# Patient Record
Sex: Female | Born: 1965 | Race: Black or African American | Hispanic: No | Marital: Married | Smoking: Never smoker
Health system: Southern US, Community
[De-identification: ages and names within clinical notes are randomized; demographics above are authoritative.]

## PROBLEM LIST (undated history)

## (undated) DIAGNOSIS — L309 Dermatitis, unspecified: Secondary | ICD-10-CM

## (undated) DIAGNOSIS — N289 Disorder of kidney and ureter, unspecified: Secondary | ICD-10-CM

## (undated) DIAGNOSIS — M25559 Pain in unspecified hip: Secondary | ICD-10-CM

## (undated) DIAGNOSIS — O903 Peripartum cardiomyopathy: Secondary | ICD-10-CM

## (undated) DIAGNOSIS — M542 Cervicalgia: Secondary | ICD-10-CM

## (undated) DIAGNOSIS — J45909 Unspecified asthma, uncomplicated: Secondary | ICD-10-CM

## (undated) DIAGNOSIS — I10 Essential (primary) hypertension: Secondary | ICD-10-CM

## (undated) DIAGNOSIS — A6000 Herpesviral infection of urogenital system, unspecified: Secondary | ICD-10-CM

## (undated) DIAGNOSIS — E119 Type 2 diabetes mellitus without complications: Secondary | ICD-10-CM

## (undated) DIAGNOSIS — M549 Dorsalgia, unspecified: Secondary | ICD-10-CM

## (undated) DIAGNOSIS — K439 Ventral hernia without obstruction or gangrene: Secondary | ICD-10-CM

## (undated) DIAGNOSIS — D219 Benign neoplasm of connective and other soft tissue, unspecified: Secondary | ICD-10-CM

## (undated) HISTORY — PX: HERNIA REPAIR: SHX51

## (undated) HISTORY — DX: Pain in unspecified hip: M25.559

## (undated) HISTORY — DX: Unspecified asthma, uncomplicated: J45.909

## (undated) HISTORY — DX: Type 2 diabetes mellitus without complications: E11.9

## (undated) HISTORY — DX: Benign neoplasm of connective and other soft tissue, unspecified: D21.9

## (undated) HISTORY — PX: OTHER SURGICAL HISTORY: SHX169

## (undated) HISTORY — DX: Herpesviral infection of urogenital system, unspecified: A60.00

## (undated) HISTORY — DX: Dorsalgia, unspecified: M54.9

## (undated) HISTORY — DX: Ventral hernia without obstruction or gangrene: K43.9

## (undated) HISTORY — DX: Dermatitis, unspecified: L30.9

## (undated) HISTORY — DX: Disorder of kidney and ureter, unspecified: N28.9

## (undated) HISTORY — PX: RENAL BIOPSY: SHX156

## (undated) HISTORY — DX: Cervicalgia: M54.2

## (undated) HISTORY — PX: US ECHOCARDIOGRAPHY: HXRAD669

## (undated) HISTORY — DX: Peripartum cardiomyopathy: O90.3

## (undated) HISTORY — PX: TUBAL LIGATION: SHX77

---

## 1998-07-21 ENCOUNTER — Ambulatory Visit (HOSPITAL_COMMUNITY): Admission: RE | Admit: 1998-07-21 | Discharge: 1998-07-21 | Payer: Self-pay | Admitting: Obstetrics & Gynecology

## 1998-07-21 ENCOUNTER — Encounter: Payer: Self-pay | Admitting: Obstetrics & Gynecology

## 1998-12-24 ENCOUNTER — Emergency Department (HOSPITAL_COMMUNITY): Admission: EM | Admit: 1998-12-24 | Discharge: 1998-12-24 | Payer: Self-pay

## 1998-12-31 ENCOUNTER — Emergency Department (HOSPITAL_COMMUNITY): Admission: EM | Admit: 1998-12-31 | Discharge: 1998-12-31 | Payer: Self-pay | Admitting: *Deleted

## 1999-10-07 ENCOUNTER — Emergency Department (HOSPITAL_COMMUNITY): Admission: EM | Admit: 1999-10-07 | Discharge: 1999-10-07 | Payer: Self-pay

## 2000-09-10 ENCOUNTER — Other Ambulatory Visit: Admission: RE | Admit: 2000-09-10 | Discharge: 2000-09-10 | Payer: Self-pay | Admitting: Obstetrics and Gynecology

## 2001-09-11 ENCOUNTER — Other Ambulatory Visit: Admission: RE | Admit: 2001-09-11 | Discharge: 2001-09-11 | Payer: Self-pay | Admitting: Obstetrics and Gynecology

## 2001-10-13 ENCOUNTER — Encounter: Payer: Self-pay | Admitting: Emergency Medicine

## 2001-10-13 ENCOUNTER — Emergency Department (HOSPITAL_COMMUNITY): Admission: EM | Admit: 2001-10-13 | Discharge: 2001-10-14 | Payer: Self-pay | Admitting: Emergency Medicine

## 2002-12-17 ENCOUNTER — Encounter: Admission: RE | Admit: 2002-12-17 | Discharge: 2002-12-17 | Payer: Self-pay | Admitting: Obstetrics and Gynecology

## 2003-01-26 ENCOUNTER — Emergency Department (HOSPITAL_COMMUNITY): Admission: EM | Admit: 2003-01-26 | Discharge: 2003-01-27 | Payer: Self-pay | Admitting: Emergency Medicine

## 2003-05-17 ENCOUNTER — Encounter: Admission: RE | Admit: 2003-05-17 | Discharge: 2003-05-17 | Payer: Self-pay | Admitting: Family Medicine

## 2004-02-15 ENCOUNTER — Emergency Department (HOSPITAL_COMMUNITY): Admission: EM | Admit: 2004-02-15 | Discharge: 2004-02-15 | Payer: Self-pay | Admitting: Emergency Medicine

## 2004-02-28 ENCOUNTER — Encounter: Admission: RE | Admit: 2004-02-28 | Discharge: 2004-02-28 | Payer: Self-pay | Admitting: Obstetrics and Gynecology

## 2005-02-05 DIAGNOSIS — E119 Type 2 diabetes mellitus without complications: Secondary | ICD-10-CM

## 2005-02-05 HISTORY — DX: Type 2 diabetes mellitus without complications: E11.9

## 2005-03-27 ENCOUNTER — Encounter: Admission: RE | Admit: 2005-03-27 | Discharge: 2005-03-27 | Payer: Self-pay | Admitting: Nephrology

## 2005-07-10 ENCOUNTER — Encounter: Admission: RE | Admit: 2005-07-10 | Discharge: 2005-07-10 | Payer: Self-pay | Admitting: Obstetrics and Gynecology

## 2005-08-15 ENCOUNTER — Inpatient Hospital Stay (HOSPITAL_COMMUNITY): Admission: AD | Admit: 2005-08-15 | Discharge: 2005-08-15 | Payer: Self-pay | Admitting: Obstetrics and Gynecology

## 2005-09-21 ENCOUNTER — Inpatient Hospital Stay (HOSPITAL_COMMUNITY): Admission: AD | Admit: 2005-09-21 | Discharge: 2005-09-24 | Payer: Self-pay | Admitting: Obstetrics and Gynecology

## 2006-01-17 ENCOUNTER — Ambulatory Visit (HOSPITAL_COMMUNITY): Admission: RE | Admit: 2006-01-17 | Discharge: 2006-01-17 | Payer: Self-pay | Admitting: Nephrology

## 2006-01-24 ENCOUNTER — Encounter (INDEPENDENT_AMBULATORY_CARE_PROVIDER_SITE_OTHER): Payer: Self-pay | Admitting: *Deleted

## 2006-01-25 ENCOUNTER — Inpatient Hospital Stay (HOSPITAL_COMMUNITY): Admission: AD | Admit: 2006-01-25 | Discharge: 2006-01-30 | Payer: Self-pay | Admitting: Nephrology

## 2006-02-01 ENCOUNTER — Inpatient Hospital Stay (HOSPITAL_COMMUNITY): Admission: EM | Admit: 2006-02-01 | Discharge: 2006-02-03 | Payer: Self-pay | Admitting: Emergency Medicine

## 2006-02-09 ENCOUNTER — Inpatient Hospital Stay (HOSPITAL_COMMUNITY): Admission: EM | Admit: 2006-02-09 | Discharge: 2006-02-12 | Payer: Self-pay | Admitting: Emergency Medicine

## 2006-02-19 ENCOUNTER — Ambulatory Visit (HOSPITAL_COMMUNITY): Admission: RE | Admit: 2006-02-19 | Discharge: 2006-02-19 | Payer: Self-pay | Admitting: General Surgery

## 2006-02-26 ENCOUNTER — Emergency Department (HOSPITAL_COMMUNITY): Admission: EM | Admit: 2006-02-26 | Discharge: 2006-02-27 | Payer: Self-pay | Admitting: Emergency Medicine

## 2006-06-12 ENCOUNTER — Encounter: Admission: RE | Admit: 2006-06-12 | Discharge: 2006-06-12 | Payer: Self-pay | Admitting: Obstetrics and Gynecology

## 2006-07-17 ENCOUNTER — Encounter: Admission: RE | Admit: 2006-07-17 | Discharge: 2006-07-17 | Payer: Self-pay | Admitting: Obstetrics and Gynecology

## 2007-01-15 ENCOUNTER — Encounter: Admission: RE | Admit: 2007-01-15 | Discharge: 2007-01-15 | Payer: Self-pay | Admitting: Obstetrics and Gynecology

## 2007-06-13 ENCOUNTER — Encounter: Admission: RE | Admit: 2007-06-13 | Discharge: 2007-06-13 | Payer: Self-pay | Admitting: Obstetrics and Gynecology

## 2007-06-19 ENCOUNTER — Encounter: Admission: RE | Admit: 2007-06-19 | Discharge: 2007-06-19 | Payer: Self-pay | Admitting: Obstetrics and Gynecology

## 2007-09-28 ENCOUNTER — Emergency Department (HOSPITAL_COMMUNITY): Admission: EM | Admit: 2007-09-28 | Discharge: 2007-09-28 | Payer: Self-pay | Admitting: Emergency Medicine

## 2008-01-20 IMAGING — US US RENAL
1 series · 14 of 25 positions shown · non-contrast
Comparison: [REDACTED] chest CT, 01/27/03.

CLINICAL DATA: Proteinuria.  
RENAL ULTRASOUND:
Technique  Complete ultrasound examination of the urinary tract was performed including evaluation of the kidneys, renal collecting systems, and urinary bladder.

[Series 1: us renal · 0.27mm/px · 14 of 31 slices shown]
[im 1/31]
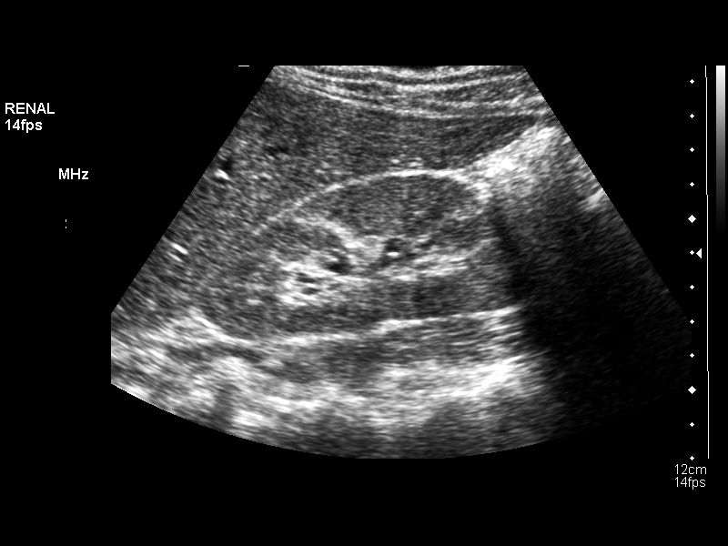
[im 3/31]
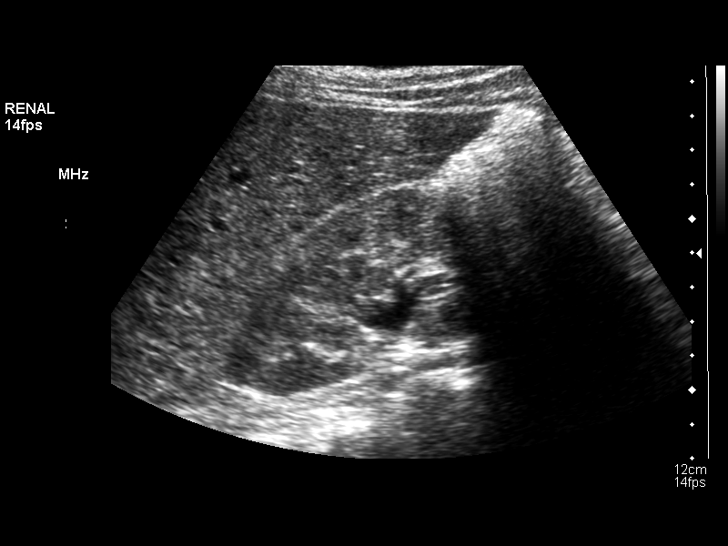
[im 6/31]
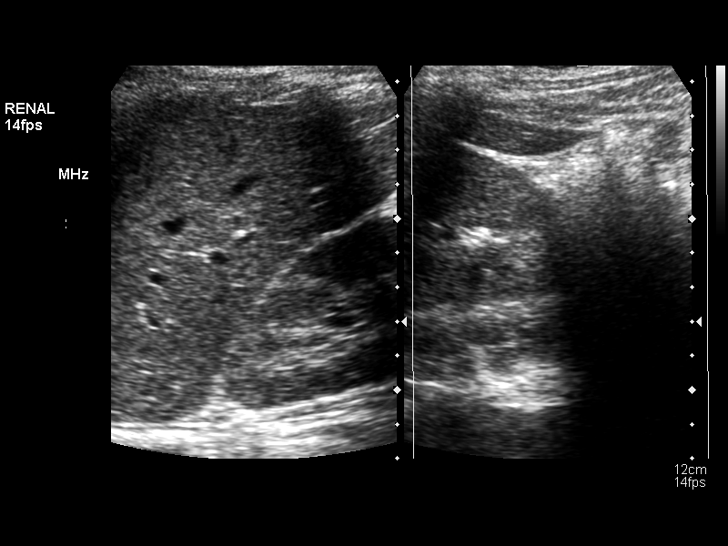
[im 8/31]
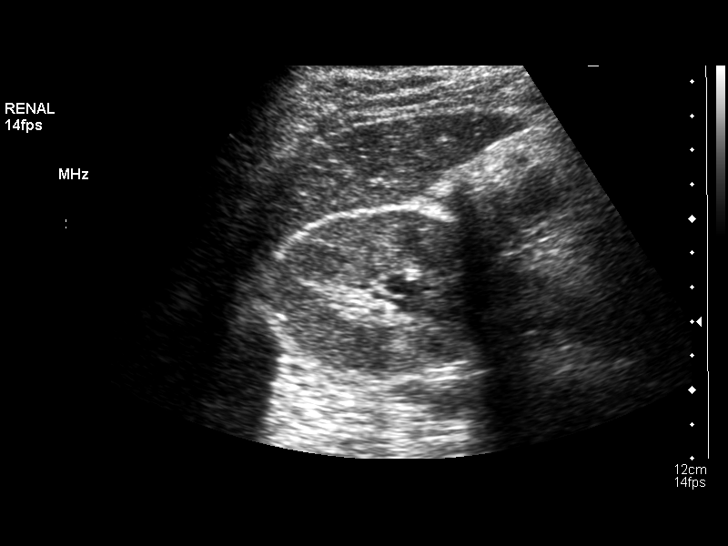
[im 11/31]
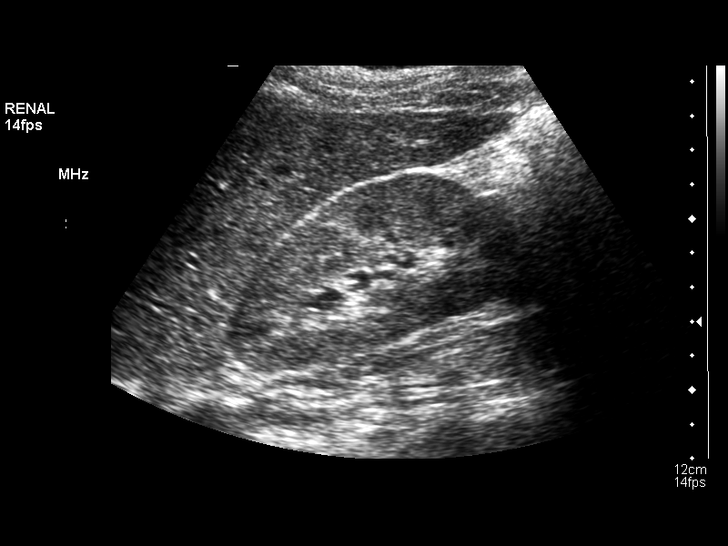
[im 12/31]
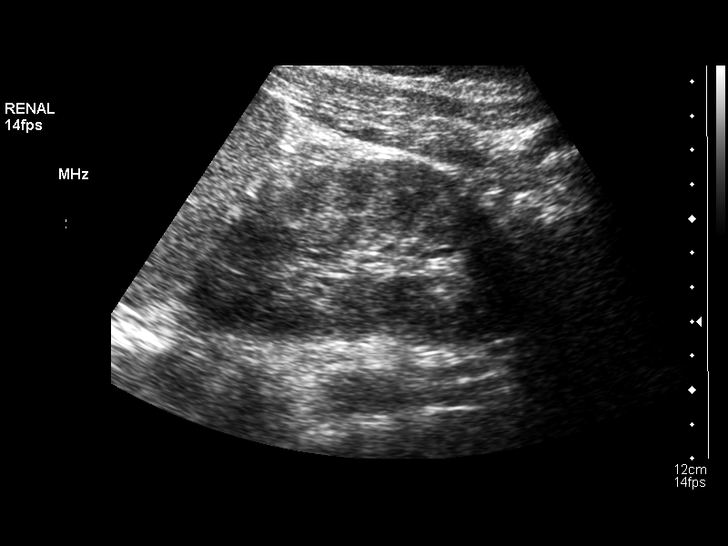
[im 14/31]
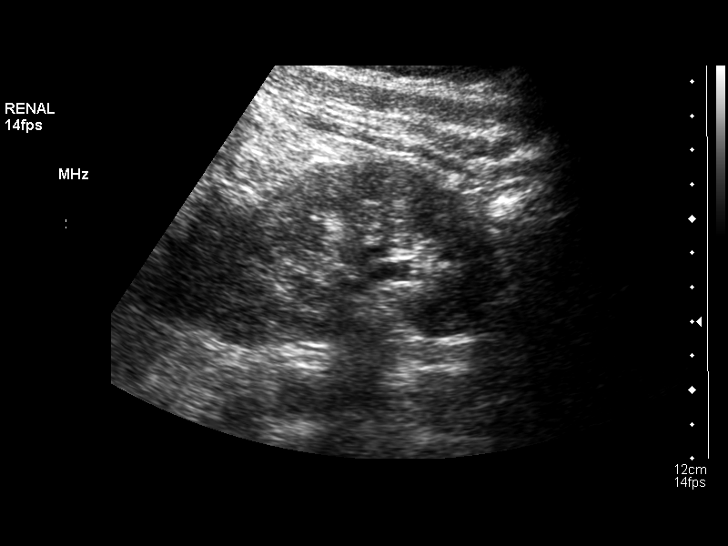
[im 17/31]
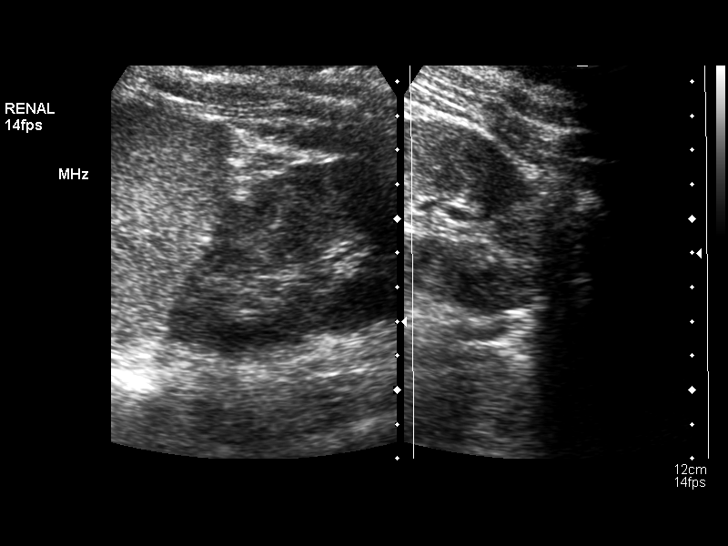
[im 19/31]
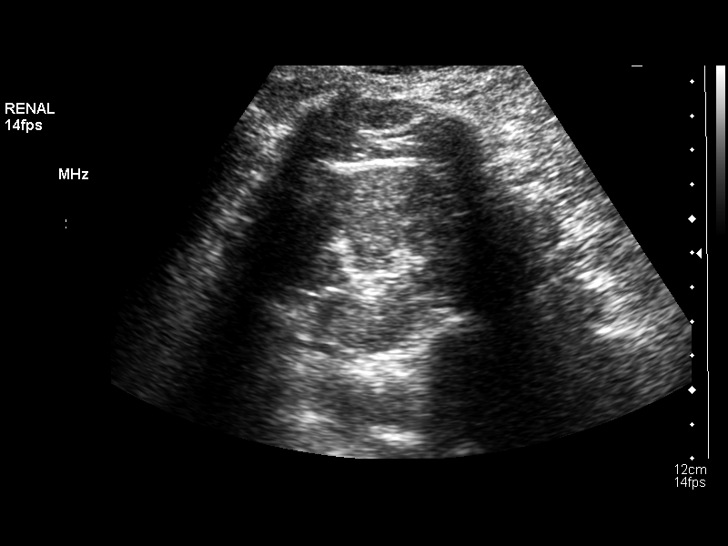
[im 21/31]
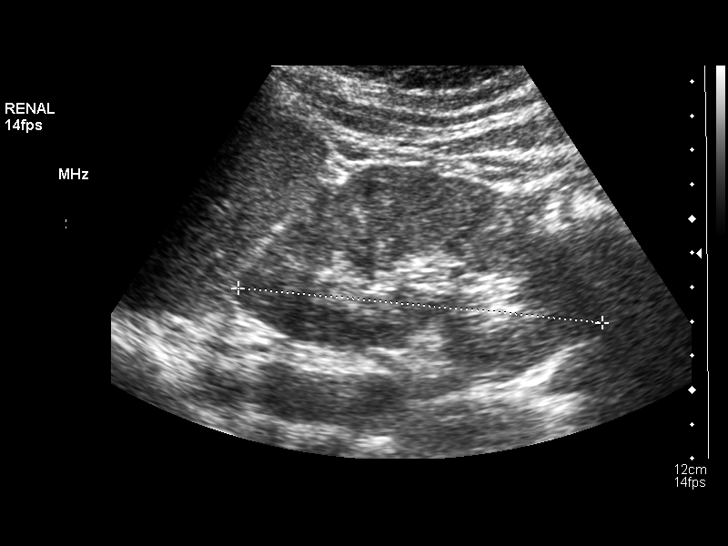
[im 23/31]
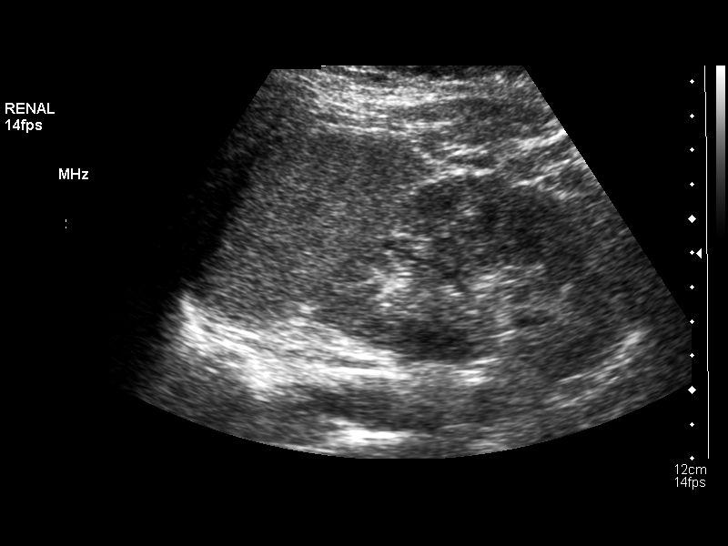
[im 26/31]
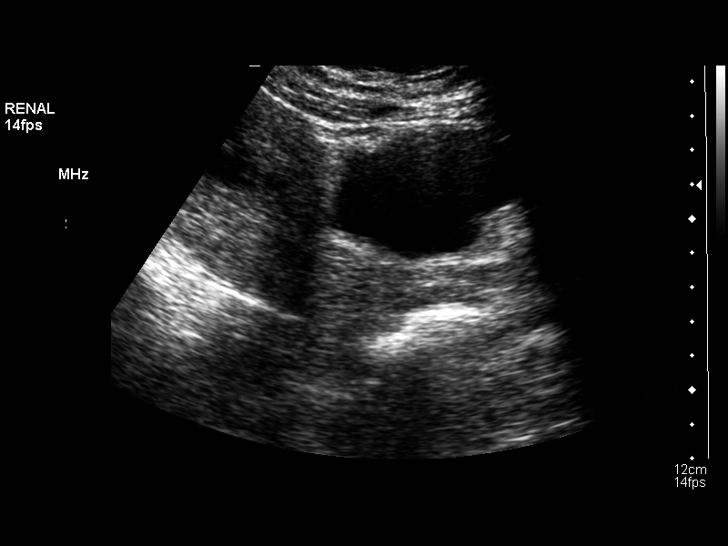
[im 28/31]
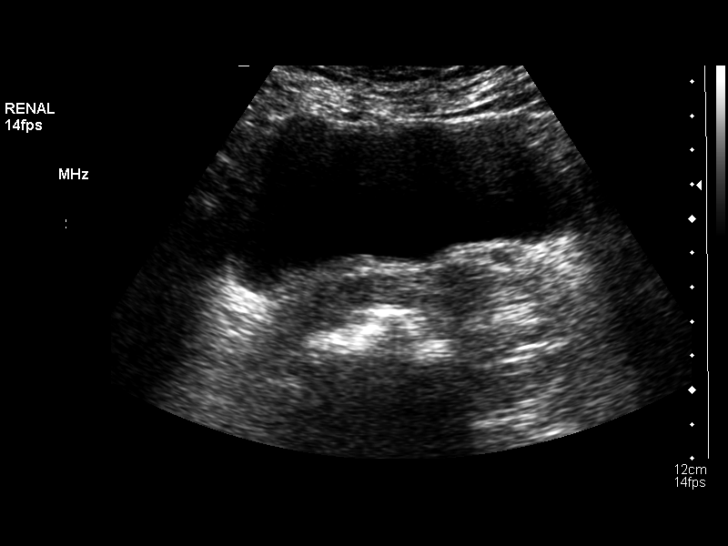
[im 31/31]
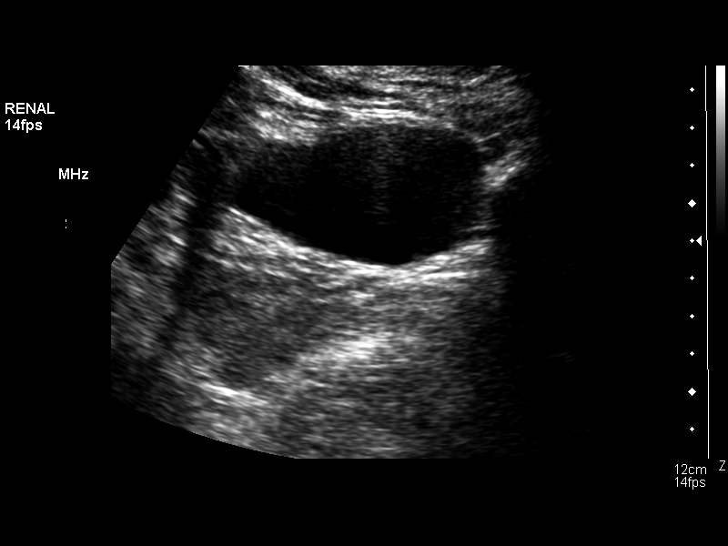

[14 of 25 positions shown; findings below may reference images not displayed]

FINDINGS: Renal cortex diffusely increased echogenicity consistent with slight nonspecific medical renal disease.  Renal size is normal with the right kidney measuring 9.5 cm long and the left kidney 10.1 cm with no focal renal lesion nor obstructive hydronephrosis seen.  Urinary bladder appears normal.
IMPRESSION: 1.  Renal cortical echogenicity change consistent with slight nonspecific medical renal disease. 
2.  Otherwise negative.

## 2008-02-21 ENCOUNTER — Inpatient Hospital Stay (HOSPITAL_COMMUNITY): Admission: AD | Admit: 2008-02-21 | Discharge: 2008-02-22 | Payer: Self-pay | Admitting: Obstetrics and Gynecology

## 2008-03-08 DIAGNOSIS — K439 Ventral hernia without obstruction or gangrene: Secondary | ICD-10-CM

## 2008-03-08 HISTORY — DX: Ventral hernia without obstruction or gangrene: K43.9

## 2008-03-12 ENCOUNTER — Inpatient Hospital Stay (HOSPITAL_COMMUNITY): Admission: AD | Admit: 2008-03-12 | Discharge: 2008-03-12 | Payer: Self-pay | Admitting: Obstetrics & Gynecology

## 2008-03-16 ENCOUNTER — Encounter: Admission: RE | Admit: 2008-03-16 | Discharge: 2008-03-16 | Payer: Self-pay | Admitting: Obstetrics and Gynecology

## 2008-06-01 ENCOUNTER — Inpatient Hospital Stay (HOSPITAL_COMMUNITY): Admission: AD | Admit: 2008-06-01 | Discharge: 2008-06-01 | Payer: Self-pay | Admitting: Obstetrics

## 2008-06-21 ENCOUNTER — Observation Stay (HOSPITAL_COMMUNITY): Admission: AD | Admit: 2008-06-21 | Discharge: 2008-06-22 | Payer: Self-pay | Admitting: Obstetrics and Gynecology

## 2008-06-24 ENCOUNTER — Encounter (INDEPENDENT_AMBULATORY_CARE_PROVIDER_SITE_OTHER): Payer: Self-pay | Admitting: Obstetrics and Gynecology

## 2008-06-24 ENCOUNTER — Inpatient Hospital Stay (HOSPITAL_COMMUNITY): Admission: AD | Admit: 2008-06-24 | Discharge: 2008-06-27 | Payer: Self-pay | Admitting: Obstetrics and Gynecology

## 2008-07-04 ENCOUNTER — Inpatient Hospital Stay (HOSPITAL_COMMUNITY): Admission: AD | Admit: 2008-07-04 | Discharge: 2008-07-04 | Payer: Self-pay | Admitting: Obstetrics & Gynecology

## 2008-07-19 ENCOUNTER — Ambulatory Visit: Payer: Self-pay | Admitting: Internal Medicine

## 2008-07-19 ENCOUNTER — Inpatient Hospital Stay (HOSPITAL_COMMUNITY): Admission: EM | Admit: 2008-07-19 | Discharge: 2008-07-23 | Payer: Self-pay | Admitting: Podiatry

## 2008-07-20 ENCOUNTER — Encounter (INDEPENDENT_AMBULATORY_CARE_PROVIDER_SITE_OTHER): Payer: Self-pay | Admitting: Internal Medicine

## 2008-08-12 ENCOUNTER — Encounter (HOSPITAL_COMMUNITY): Admission: RE | Admit: 2008-08-12 | Discharge: 2008-10-06 | Payer: Self-pay | Admitting: Interventional Cardiology

## 2009-05-11 IMAGING — CR DG ORBITS FOR FOREIGN BODY
2 series · 2 of 2 positions shown · non-contrast
Comparison: none

CLINICAL DATA: Metal working/exposure;  clearance prior to MRI

ORBITS FOR FOREIGN BODY - 2 VIEW:

[view not recorded (1 of 2)]
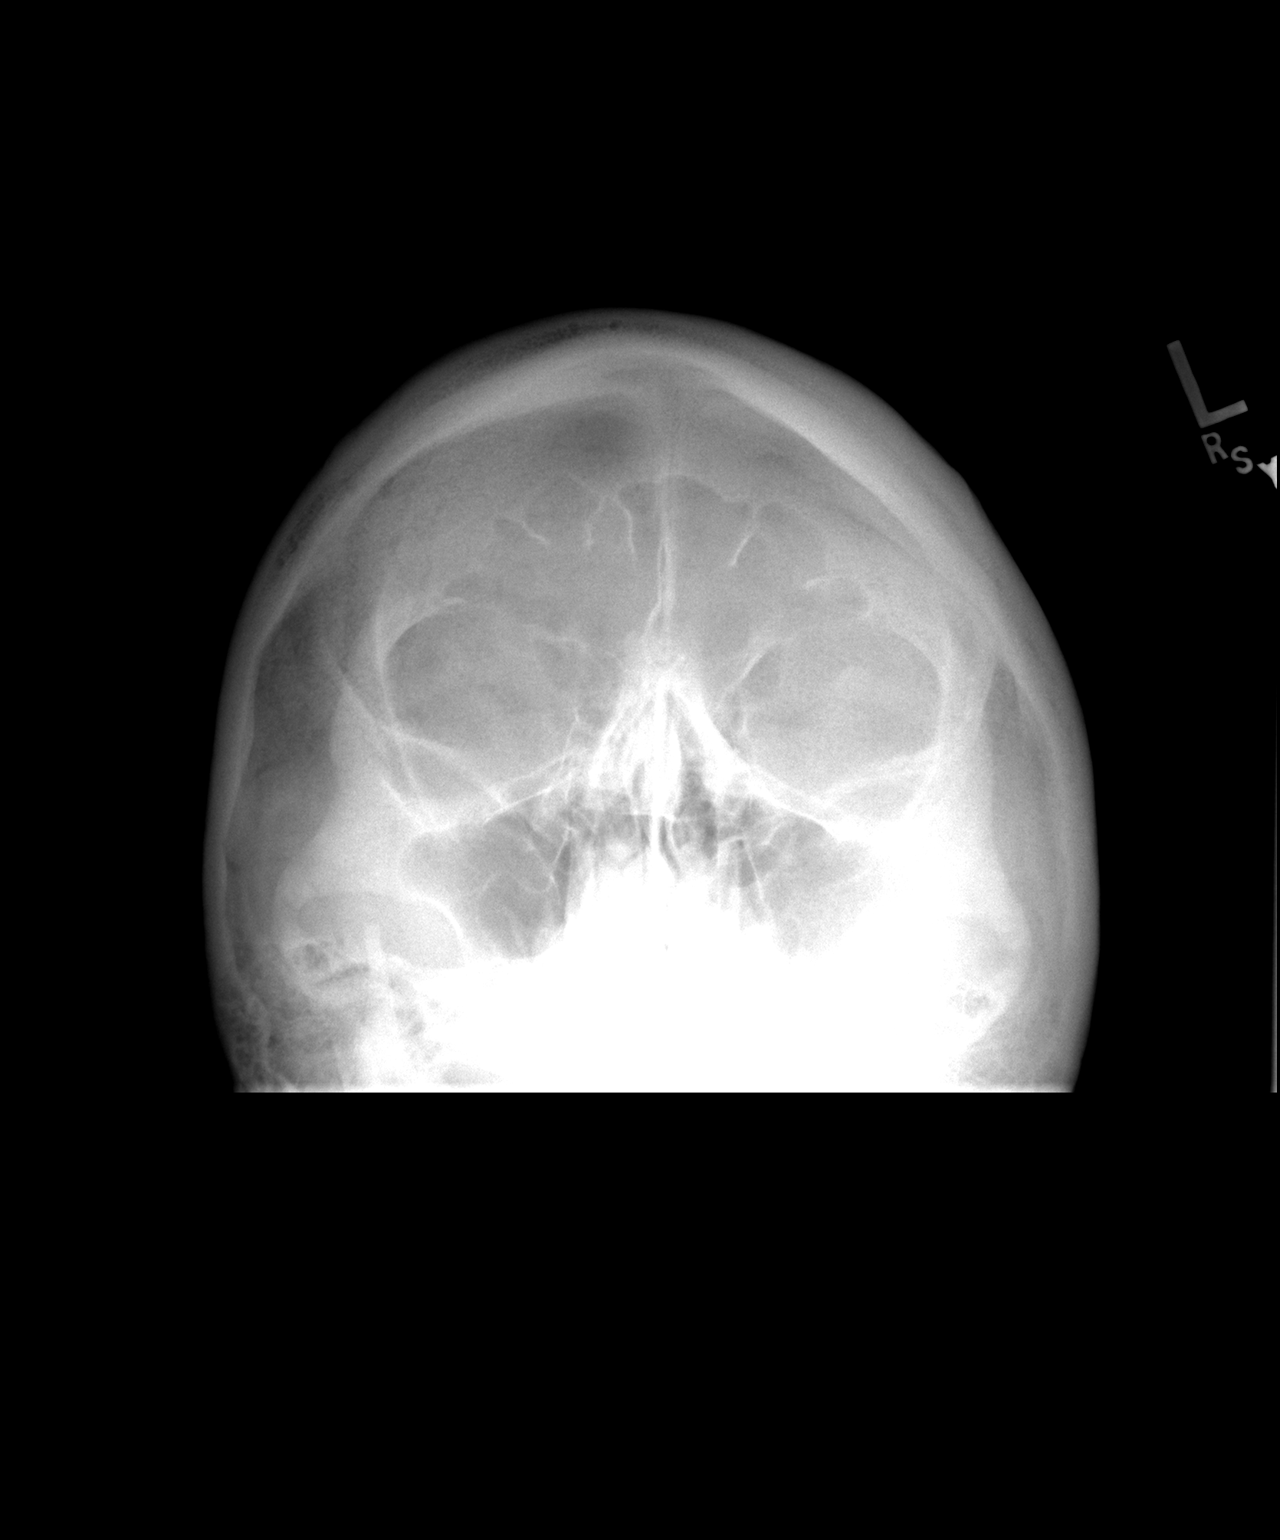

[view not recorded (2 of 2)]
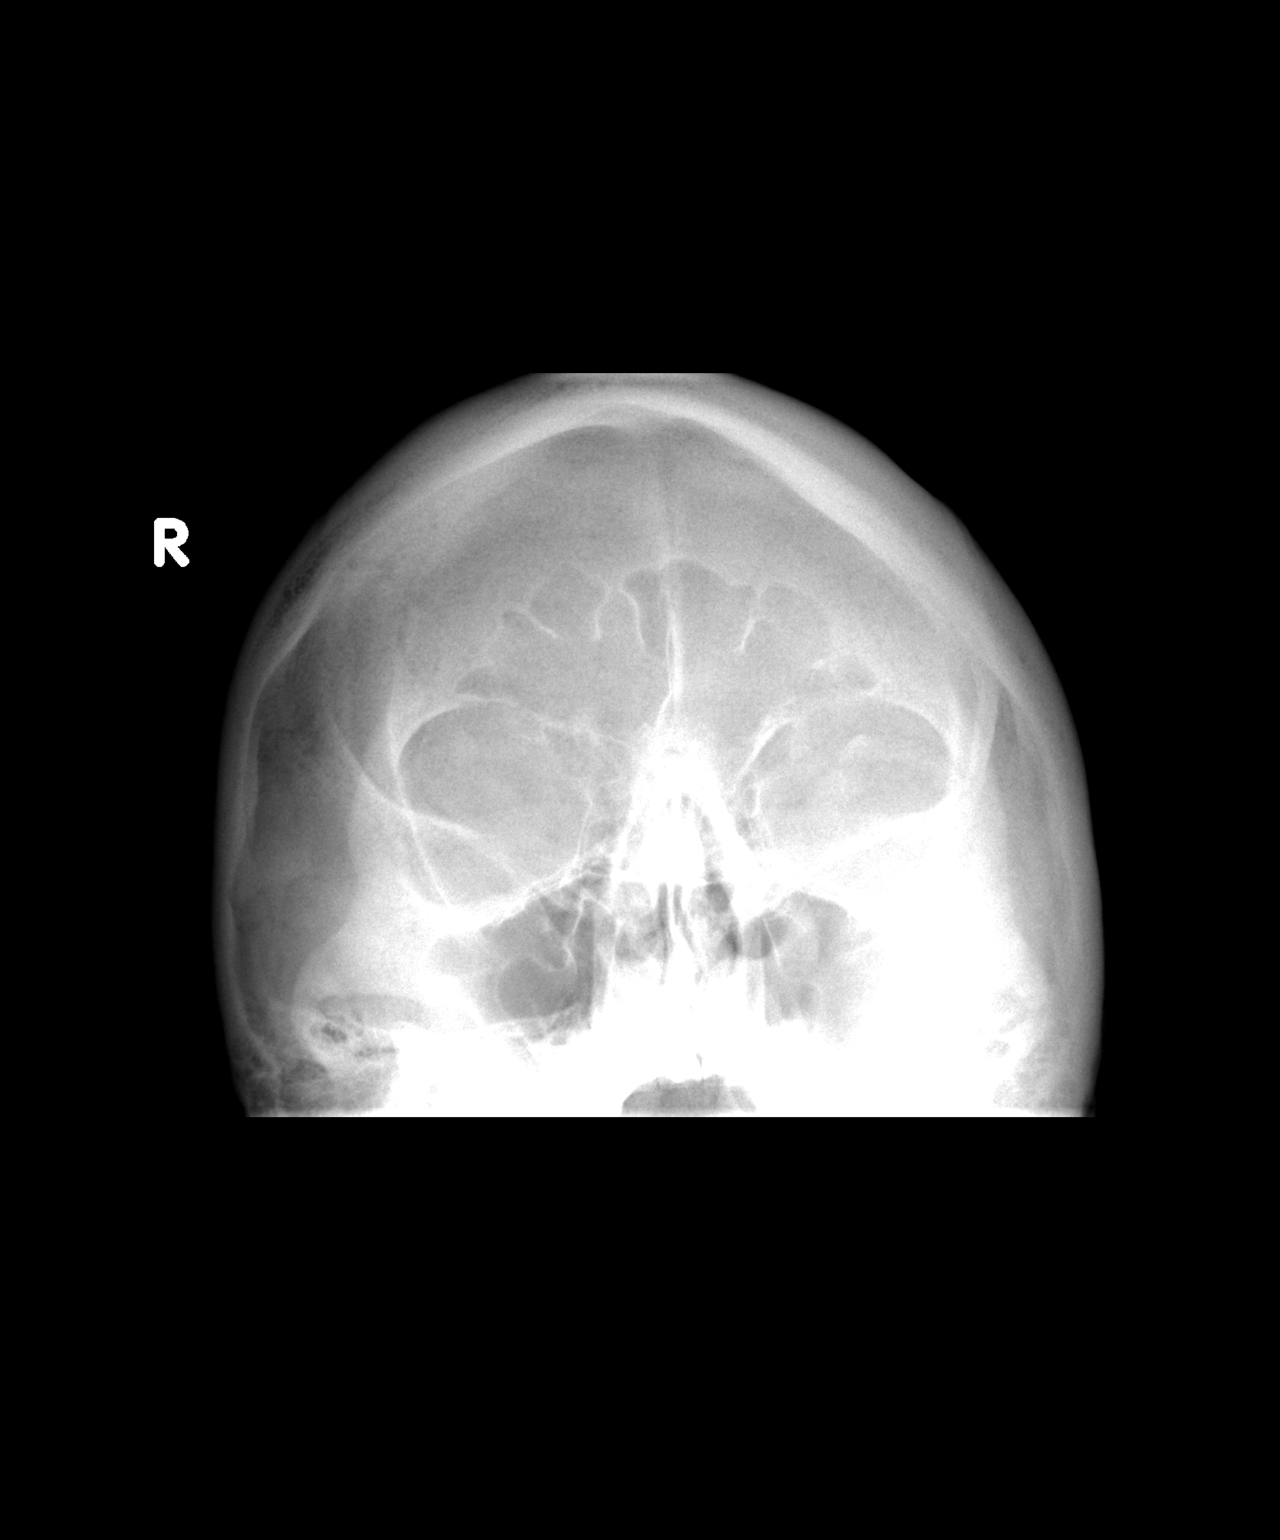

[2 of 2 positions shown; findings below may reference images not displayed]

FINDINGS: There is no evidence of metallic foreign body within the orbits.  No
significant bone abnormality identified.
IMPRESSION: No evidence of metallic foreign body within the orbits.

## 2009-08-13 ENCOUNTER — Ambulatory Visit: Payer: Self-pay | Admitting: Surgery

## 2009-08-13 ENCOUNTER — Encounter: Payer: Self-pay | Admitting: Family Medicine

## 2009-08-13 ENCOUNTER — Ambulatory Visit (HOSPITAL_COMMUNITY)
Admission: RE | Admit: 2009-08-13 | Discharge: 2009-08-13 | Payer: Self-pay | Source: Home / Self Care | Admitting: Family Medicine

## 2010-03-31 ENCOUNTER — Inpatient Hospital Stay (HOSPITAL_COMMUNITY): Payer: PRIVATE HEALTH INSURANCE

## 2010-03-31 ENCOUNTER — Inpatient Hospital Stay (HOSPITAL_COMMUNITY)
Admission: AD | Admit: 2010-03-31 | Discharge: 2010-03-31 | Disposition: A | Discharge status: Home / Self Care | Payer: PRIVATE HEALTH INSURANCE | Source: Ambulatory Visit | Attending: Obstetrics and Gynecology | Admitting: Obstetrics and Gynecology

## 2010-03-31 DIAGNOSIS — N949 Unspecified condition associated with female genital organs and menstrual cycle: Secondary | ICD-10-CM | POA: Insufficient documentation

## 2010-03-31 DIAGNOSIS — N938 Other specified abnormal uterine and vaginal bleeding: Secondary | ICD-10-CM | POA: Insufficient documentation

## 2010-03-31 LAB — CBC
HCT: 36.9 % (ref 36.0–46.0)
Hemoglobin: 11.1 g/dL — ABNORMAL LOW (ref 12.0–15.0)
MCH: 21.2 pg — ABNORMAL LOW (ref 26.0–34.0)
MCV: 70.6 fL — ABNORMAL LOW (ref 78.0–100.0)
Platelets: 272 10*3/uL (ref 150–400)
RBC: 5.23 MIL/uL — ABNORMAL HIGH (ref 3.87–5.11)
WBC: 13.1 10*3/uL — ABNORMAL HIGH (ref 4.0–10.5)

## 2010-03-31 LAB — COMPREHENSIVE METABOLIC PANEL
ALT: 14 U/L (ref 0–35)
AST: 18 U/L (ref 0–37)
Alkaline Phosphatase: 79 U/L (ref 39–117)
CO2: 28 mEq/L (ref 19–32)
Calcium: 9.3 mg/dL (ref 8.4–10.5)
Chloride: 101 mEq/L (ref 96–112)
GFR calc Af Amer: >60 mL/min (ref >60)
GFR calc non Af Amer: >60 mL/min (ref >60)
Glucose, Bld: 116 mg/dL — ABNORMAL HIGH (ref 70–99)
Potassium: 3.6 mEq/L (ref 3.5–5.1)
Sodium: 136 mEq/L (ref 135–145)

## 2010-03-31 LAB — DIFFERENTIAL
Lymphocytes Relative: 9 % — ABNORMAL LOW (ref 12–46)
Lymphs Abs: 1.2 10*3/uL (ref 0.7–4.0)
Monocytes Relative: 4 % (ref 3–12)
Neutrophils Relative %: 85 % — ABNORMAL HIGH (ref 43–77)

## 2010-05-01 ENCOUNTER — Encounter (HOSPITAL_COMMUNITY)
Admission: RE | Admit: 2010-05-01 | Discharge: 2010-05-01 | Disposition: A | Discharge status: Home / Self Care | Payer: PRIVATE HEALTH INSURANCE | Source: Ambulatory Visit | Attending: Obstetrics and Gynecology | Admitting: Obstetrics and Gynecology

## 2010-05-01 DIAGNOSIS — Z01810 Encounter for preprocedural cardiovascular examination: Secondary | ICD-10-CM | POA: Insufficient documentation

## 2010-05-01 DIAGNOSIS — Z01812 Encounter for preprocedural laboratory examination: Secondary | ICD-10-CM | POA: Insufficient documentation

## 2010-05-01 LAB — SURGICAL PCR SCREEN: Staphylococcus aureus: NEGATIVE

## 2010-05-01 LAB — BASIC METABOLIC PANEL
BUN: 8 mg/dL (ref 6–23)
Calcium: 9 mg/dL (ref 8.4–10.5)
GFR calc non Af Amer: >60 mL/min (ref >60)
Glucose, Bld: 89 mg/dL (ref 70–99)
Sodium: 141 mEq/L (ref 135–145)

## 2010-05-01 LAB — CBC
HCT: 35.9 % — ABNORMAL LOW (ref 36.0–46.0)
MCHC: 30.4 g/dL (ref 30.0–36.0)
Platelets: 248 10*3/uL (ref 150–400)
RDW: 19.2 % — ABNORMAL HIGH (ref 11.5–15.5)

## 2010-05-07 HISTORY — PX: ABDOMINAL HYSTERECTOMY: SUR658

## 2010-05-12 ENCOUNTER — Other Ambulatory Visit: Payer: Self-pay | Admitting: Obstetrics and Gynecology

## 2010-05-12 ENCOUNTER — Inpatient Hospital Stay (HOSPITAL_COMMUNITY)
Admission: RE | Admit: 2010-05-12 | Discharge: 2010-05-14 | DRG: 742 | Disposition: A | Discharge status: Home / Self Care | Payer: PRIVATE HEALTH INSURANCE | Source: Ambulatory Visit | Attending: Obstetrics and Gynecology | Admitting: Obstetrics and Gynecology

## 2010-05-12 DIAGNOSIS — N920 Excessive and frequent menstruation with regular cycle: Secondary | ICD-10-CM | POA: Diagnosis present

## 2010-05-12 DIAGNOSIS — Y9210 Unspecified residential institution as the place of occurrence of the external cause: Secondary | ICD-10-CM | POA: Diagnosis not present

## 2010-05-12 DIAGNOSIS — N8000 Endometriosis of the uterus, unspecified: Secondary | ICD-10-CM | POA: Diagnosis present

## 2010-05-12 DIAGNOSIS — D251 Intramural leiomyoma of uterus: Secondary | ICD-10-CM | POA: Diagnosis present

## 2010-05-12 DIAGNOSIS — IMO0002 Reserved for concepts with insufficient information to code with codable children: Secondary | ICD-10-CM | POA: Diagnosis not present

## 2010-05-12 DIAGNOSIS — N736 Female pelvic peritoneal adhesions (postinfective): Secondary | ICD-10-CM | POA: Diagnosis present

## 2010-05-12 DIAGNOSIS — N946 Dysmenorrhea, unspecified: Principal | ICD-10-CM | POA: Diagnosis present

## 2010-05-13 LAB — BASIC METABOLIC PANEL
BUN: 5 mg/dL — ABNORMAL LOW (ref 6–23)
Creatinine, Ser: 0.71 mg/dL (ref 0.4–1.2)
GFR calc non Af Amer: >60 mL/min (ref >60)

## 2010-05-13 LAB — CBC
MCH: 22.4 pg — ABNORMAL LOW (ref 26.0–34.0)
MCV: 72.8 fL — ABNORMAL LOW (ref 78.0–100.0)
Platelets: 261 10*3/uL (ref 150–400)
RDW: 18.5 % — ABNORMAL HIGH (ref 11.5–15.5)
WBC: 15.8 10*3/uL — ABNORMAL HIGH (ref 4.0–10.5)

## 2010-05-15 LAB — CBC
HCT: 36.3 % (ref 36.0–46.0)
MCV: 76.2 fL — ABNORMAL LOW (ref 78.0–100.0)
MCV: 76.7 fL — ABNORMAL LOW (ref 78.0–100.0)
Platelets: 372 10*3/uL (ref 150–400)
RBC: 4.76 MIL/uL (ref 3.87–5.11)
WBC: 5.2 10*3/uL (ref 4.0–10.5)
WBC: 5.8 10*3/uL (ref 4.0–10.5)

## 2010-05-15 LAB — BASIC METABOLIC PANEL
BUN: 11 mg/dL (ref 6–23)
BUN: 15 mg/dL (ref 6–23)
BUN: 7 mg/dL (ref 6–23)
CO2: 27 mEq/L (ref 19–32)
CO2: 29 mEq/L (ref 19–32)
CO2: 28 mEq/L (ref 19–32)
Calcium: 10 mg/dL (ref 8.4–10.5)
Calcium: 9.5 mg/dL (ref 8.4–10.5)
Calcium: 9.9 mg/dL (ref 8.4–10.5)
Chloride: 104 mEq/L (ref 96–112)
Chloride: 108 mEq/L (ref 96–112)
Creatinine, Ser: 0.93 mg/dL (ref 0.4–1.2)
Creatinine, Ser: 0.93 mg/dL (ref 0.4–1.2)
GFR calc Af Amer: >60 mL/min (ref >60)
GFR calc Af Amer: >60 mL/min (ref >60)
GFR calc non Af Amer: >60 mL/min (ref >60)
Glucose, Bld: 109 mg/dL — ABNORMAL HIGH (ref 70–99)
Glucose, Bld: 112 mg/dL — ABNORMAL HIGH (ref 70–99)
Potassium: 4.3 mEq/L (ref 3.5–5.1)
Potassium: 2.8 mEq/L — ABNORMAL LOW (ref 3.5–5.1)
Potassium: 3.3 mEq/L — ABNORMAL LOW (ref 3.5–5.1)
Sodium: 139 mEq/L (ref 135–145)
Sodium: 141 mEq/L (ref 135–145)
Sodium: 141 mEq/L (ref 135–145)

## 2010-05-15 LAB — DIFFERENTIAL
Lymphocytes Relative: 36 % (ref 12–46)
Lymphs Abs: 1.9 10*3/uL (ref 0.7–4.0)
Monocytes Relative: 11 % (ref 3–12)
Neutro Abs: 2.5 10*3/uL (ref 1.7–7.7)
Neutrophils Relative %: 49 % (ref 43–77)

## 2010-05-15 LAB — LIPID PANEL
Cholesterol: 140 mg/dL (ref 0–200)
HDL: 32 mg/dL — ABNORMAL LOW (ref >39)

## 2010-05-15 LAB — BRAIN NATRIURETIC PEPTIDE
Pro B Natriuretic peptide (BNP): 642 pg/mL — ABNORMAL HIGH (ref 0.0–100.0)
Pro B Natriuretic peptide (BNP): 1480 pg/mL — ABNORMAL HIGH (ref 0.0–100.0)
Pro B Natriuretic peptide (BNP): 1543 pg/mL — ABNORMAL HIGH (ref 0.0–100.0)

## 2010-05-15 LAB — CARDIAC PANEL(CRET KIN+CKTOT+MB+TROPI)
CK, MB: 1.7 ng/mL (ref 0.3–4.0)
CK, MB: 1.9 ng/mL (ref 0.3–4.0)
Relative Index: INVALID (ref 0.0–2.5)
Relative Index: INVALID (ref 0.0–2.5)
Total CK: 92 U/L (ref 7–177)
Troponin I: 0.14 ng/mL — ABNORMAL HIGH (ref 0.00–0.06)

## 2010-05-15 LAB — HEPATIC FUNCTION PANEL
ALT: 22 U/L (ref 0–35)
Indirect Bilirubin: 1.1 mg/dL — ABNORMAL HIGH (ref 0.3–0.9)
Total Protein: 6.1 g/dL (ref 6.0–8.3)

## 2010-05-15 LAB — D-DIMER, QUANTITATIVE: D-Dimer, Quant: 1.56 ug/mL-FEU — ABNORMAL HIGH (ref 0.00–0.48)

## 2010-05-16 LAB — CBC
HCT: 33.6 % — ABNORMAL LOW (ref 36.0–46.0)
HCT: 35.7 % — ABNORMAL LOW (ref 36.0–46.0)
HCT: 35.7 % — ABNORMAL LOW (ref 36.0–46.0)
Hemoglobin: 11.2 g/dL — ABNORMAL LOW (ref 12.0–15.0)
Hemoglobin: 11.9 g/dL — ABNORMAL LOW (ref 12.0–15.0)
MCHC: 32.9 g/dL (ref 30.0–36.0)
MCV: 77.3 fL — ABNORMAL LOW (ref 78.0–100.0)
MCV: 77.5 fL — ABNORMAL LOW (ref 78.0–100.0)
MCV: 77.6 fL — ABNORMAL LOW (ref 78.0–100.0)
MCV: 77.9 fL — ABNORMAL LOW (ref 78.0–100.0)
Platelets: 194 10*3/uL (ref 150–400)
Platelets: 305 10*3/uL (ref 150–400)
RBC: 3.76 MIL/uL — ABNORMAL LOW (ref 3.87–5.11)
RBC: 4.61 MIL/uL (ref 3.87–5.11)
RDW: 18.2 % — ABNORMAL HIGH (ref 11.5–15.5)
WBC: 10.5 10*3/uL (ref 4.0–10.5)
WBC: 7.2 10*3/uL (ref 4.0–10.5)
WBC: 7.7 10*3/uL (ref 4.0–10.5)
WBC: 8.9 10*3/uL (ref 4.0–10.5)

## 2010-05-16 LAB — COMPREHENSIVE METABOLIC PANEL
Alkaline Phosphatase: 290 U/L — ABNORMAL HIGH (ref 39–117)
BUN: 6 mg/dL (ref 6–23)
CO2: 20 mEq/L (ref 19–32)
Chloride: 104 mEq/L (ref 96–112)
Creatinine, Ser: 0.71 mg/dL (ref 0.4–1.2)
GFR calc non Af Amer: >60 mL/min (ref >60)
Total Bilirubin: 0.6 mg/dL (ref 0.3–1.2)

## 2010-05-16 LAB — URINE CULTURE: Colony Count: 6000

## 2010-05-16 LAB — URINALYSIS, ROUTINE W REFLEX MICROSCOPIC
Bilirubin Urine: NEGATIVE
Glucose, UA: NEGATIVE mg/dL
Ketones, ur: NEGATIVE mg/dL
Nitrite: NEGATIVE
Specific Gravity, Urine: 1.02 (ref 1.005–1.030)
pH: 7 (ref 5.0–8.0)

## 2010-05-16 LAB — URINE MICROSCOPIC-ADD ON

## 2010-05-16 LAB — URIC ACID: Uric Acid, Serum: 6.8 mg/dL (ref 2.4–7.0)

## 2010-05-17 LAB — COMPREHENSIVE METABOLIC PANEL
ALT: 14 U/L (ref 0–35)
AST: 20 U/L (ref 0–37)
CO2: 28 mEq/L (ref 19–32)
Chloride: 102 mEq/L (ref 96–112)
Creatinine, Ser: 0.78 mg/dL (ref 0.4–1.2)
GFR calc Af Amer: >60 mL/min (ref >60)
GFR calc non Af Amer: >60 mL/min (ref >60)
Glucose, Bld: 105 mg/dL — ABNORMAL HIGH (ref 70–99)
Sodium: 137 mEq/L (ref 135–145)
Total Bilirubin: 0.8 mg/dL (ref 0.3–1.2)

## 2010-05-17 LAB — URINALYSIS, ROUTINE W REFLEX MICROSCOPIC
Bilirubin Urine: NEGATIVE
Ketones, ur: NEGATIVE mg/dL
Nitrite: NEGATIVE
Protein, ur: NEGATIVE mg/dL
pH: 6.5 (ref 5.0–8.0)

## 2010-05-17 LAB — LACTATE DEHYDROGENASE: LDH: 144 U/L (ref 94–250)

## 2010-05-17 LAB — CBC
Hemoglobin: 10.6 g/dL — ABNORMAL LOW (ref 12.0–15.0)
MCHC: 33.4 g/dL (ref 30.0–36.0)
MCV: 77.6 fL — ABNORMAL LOW (ref 78.0–100.0)
RBC: 4.11 MIL/uL (ref 3.87–5.11)
WBC: 7.8 10*3/uL (ref 4.0–10.5)

## 2010-05-17 LAB — URINE CULTURE: Colony Count: 3000

## 2010-05-23 LAB — URINALYSIS, ROUTINE W REFLEX MICROSCOPIC
Glucose, UA: NEGATIVE mg/dL
Hgb urine dipstick: NEGATIVE
Ketones, ur: NEGATIVE mg/dL
Protein, ur: NEGATIVE mg/dL
Urobilinogen, UA: 0.2 mg/dL (ref 0.0–1.0)

## 2010-05-24 NOTE — Op Note (Signed)
NAME:  Shannon Mason, Shannon Mason            ACCOUNT NO.:  [id number]  MEDICAL RECORD NO.:  [id number]           PATIENT TYPE:  I  LOCATION:  9304                          [facility]ACILITY:  [facility]  PHYSICIAN:  Maxie Better, M.D.DATE OF BIRTH:  14-Mar-1965  DATE OF PROCEDURE:  05/12/2010 DATE OF DISCHARGE:                              OPERATIVE REPORT   PREOPERATIVE DIAGNOSES:  Dysmenorrhea, menorrhagia, and previous cesarean section x4.  PROCEDURE:  Exploratory laparotomy, total abdominal hysterectomy, lysis of adhesions, repair of bowel serosal tear.  POSTOPERATIVE DIAGNOSES:  Dysmenorrhea, menorrhagia, abdominal adhesions, and previous cesarean section x4.  SURGEON:  Maxie Better, MD  ASSISTANT:  Darryl Nestle, MD.  INTRAOPERATIVE CONSULTATION:  Dr. Gerrit Friends.  ANESTHESIA:  General.  PROCEDURE:  Under adequate general anesthesia, the patient was placed in the supine position.  She was sterilely prepped and draped in the usual fashion.  Indwelling Foley catheter was sterilely placed.  The patient's history was notable for previous anterior abdominal wall mesh placed for multiple ventral hernias.  A Pfannenstiel skin incision was noted. Marcaine 0.25% was injected along the previous Pfannenstiel scar.  An incision was placed through that scar and carried down to the rectus fascia.  Rectus fascia was then opened transversely.  Rectus fascia was sharply and bluntly dissected off the rectus muscle in superior and inferior fashion and dissected off the inferior fashion.  It was noted the permanent sutures from the meshes were on either side of the midline.  The rectus muscle was opened in the midline and at that point, careful dissection resulted in the anterior abdominal mesh be noted. Subsequently a small window in the peritoneum was opened resulting in the bowels being able to be visualized adherent to the anterior abdominal wall in part.  Very limited ability to go further  inferiorly or upwardly due to the hernia mesh present as well as the bowels. Decision at that point was made to consult intraoperative management by general surgeon. Dr. Gerrit Friends arrived.  Please see his dictated operative report.  After he was able to extend the mesh in the midline superiorly and take on the bowels as well as the repair of the rent serosal tear, the incision was extended inferiorly.  The uterus was then exteriorized and the tubes and ovaries were noted to be normal, paratubal cyst noted bilaterally on both tubes.  The bladder adhesions were from the previous cesarean section.  A self-retaining Balfour retractor was placed.  The bladder peritoneum was opened sharply and a sharp dissection was then utilized to bring the bladder down.  The round ligaments on the right was then suture ligated with 0 Vicryl x2.  The intervening segments were opened.  The anterior bladder peritoneum was then completed on the right, the posterior leaf of the broad ligament opened on the right. The LigaSure was then utilized to serially clamp, cauterize and cut the utero-ovarian ligaments on the right.  The uterine vessels on the right was then skeletonized and  was then doubly clamped, cut, suture ligated with 0 Vicryl x2.  On the opposite side, the same procedure was then performed.  The uterine vessels on the left was  then subsequently doubly clamped, suture ligated with 0 Vicryl x2.  The cardinal ligaments were then bilaterally clamped, cut and suture ligated with 0 Vicryl.  The uterosacral ligaments was subsequently doubly bilaterally clamped, cut, and suture ligated with 0 Vicryl.  The bladder was further placed inferiorly.  Bleeding along the bladder flap area was noted, resulting in a figure-of-eight sutures being placed, cauterization being done. There was an aberrant vessel noted there, that tend to have a small rent and bleed and it was clamped, suture ligated with 3-0 Vicryl  sutures. Subsequently, the cervicovaginal junction was reached at which time bilateral clamps were placed in the angle and the cervix was subsequently removed from its vaginal attachment.  Once the cervix was removed from its vaginal attachment, again bleeding was noted in the same area with the bladder resection.  Suture placement with 0 Vicryl was done bleeding continued once the cervix was removed from its attachment, the intervening anterior leaf of the vaginal cuff was then clamped and brought up into the field further suture placement was then done with hemostasis and partially noted.  The intervening vaginal cuff was oversewn with 3-0 Vicryl suture.  The angle sutures were then the closed with a figure-of-eight Heaney suturing 0 Vicryl suturing and cauterization of area just under the bladder flap continued.  When hemostasis appeared to have been achieved, the vagina was then closed with interrupted figure-of-eight suture in a horizontal fashion.  The vaginal tissue was tearing with just clamping with the Kocher clamps. The ureters were noted to be peristalsing bilaterally.  Small bleeding again was noted.  Cauterization and/or suture ligation was done.  Once the bleeding was appeared to have been hemostasis, the pedicles again were looked at and particularly the ovarian ligaments and to small bleeders were cauterized.  The pelvis was then copiously irrigated and suctioned of debris.  The paratubal cyst on the both ovaries were removed, they were small and therefore not sent.  The Surgicel was placed over the vaginal cuff area after the procedure was done to further assist with hemostasis.  The retractor was subsequently removed. The packings removed.  The bowel that had been per previously taken off the anterior abdominal wall was reinspected.  Small bleeders and the surface was noted.  Careful cauterization was done, however, no area that needed to be sutured was present besides  what had been previously done.  The loop of bowel was then covered with Interceed.  The mesh which had been opened vertically was then closed with interrupted #1 Prolene sutures in a vertical fashion.  Once that was accomplished, the rectus fascia was closed with 0 Vicryl x2.  The subcutaneous areas irrigated, small bleeders cauterized.  Interrupted 2-0 plain sutures placed and the skin approximated with Ethicon staples.  SPECIMENS:  Uterus with cervix sent to Pathology.  The weight of specimen was 172 grams.  INTRAOPERATIVE FLUIDS:  2 liters.  URINE OUTPUT:  960 mL, clear yellow urine.  ESTIMATED BLOOD LOSS:  300 mL.  Sponge, instrument counts x2 was correct.  COMPLICATION:  None.  The patient tolerated the procedure well and was transferred to recovery in stable condition.     Maxie Better, M.D.    [place]/MEDQ  D:  05/12/2010  T:  05/13/2010  Job:  [id number]  Electronically Signed by Nena Jordan Stokes Rattigan M.D. on 05/24/2010 03:30:25 PM

## 2010-05-26 NOTE — Op Note (Signed)
  NAME:  Shannon Mason, Shannon Mason            ACCOUNT NO.:  [id number]  MEDICAL RECORD NO.:  [id number]           PATIENT TYPE:  I  LOCATION:  9304                          [facility]ACILITY:  [facility]  PHYSICIAN:  Velora Heckler, MD      DATE OF BIRTH:  1965/11/24  DATE OF PROCEDURE:  05/12/2010                               OPERATIVE REPORT   Intraoperative consultation from Dr. Maxie Better due to unexpected adhesions and synthetic mesh repair of abdominal wall.  The patient was undergoing total abdominal hysterectomy by Dr. Cherly Hensen.  She has had multiple prior cesarean sections.  There the patient has also undergone a ventral incisional hernia repair with mesh by Dr. Estelle Grumbles approximately 1 year ago.  At the time of surgery today, significant adhesions were found when Dr. Cherly Hensen attempted to incise the abdominal wall mesh.  General surgery was consulted for lysis of adhesions and assistance.  PROCEDURES: 1. Lysis of adhesions. 2. Repair of serosal tears x2.  SURGEON:  Velora Heckler, MD, FACS  ASSISTANT:  Maxie Better, MD  ANESTHESIA:  General.  ESTIMATED BLOOD LOSS:  Minimal.  Preparation appears to be Betadine.  COMPLICATIONS:  None.  BODY OF REPORT:  The patient had previously been anesthetized in room 8 at [facility].  Laparotomy had been performed by Dr. Cherly Hensen.  Upon entering the operating room, I scrubbed and Dr. Cherly Hensen demonstrated the extent of her dissection.  The patient had a Pfannenstiel-type incision. Anterior fascial layers had been incised.  However in the posterior layer was a sheet of synthetic mesh.  This had been opened for approximately 4 cm and small bowel was exposed.  Using sharp dissection, the adhesions of the small bowel to the underlying mesh and anterior abdominal wall were taken down sharply. Mesh was then split in the midline extending the incision cephalad. More adhesions were taken down provided for an adequate operative field to  perform hysterectomy.  Two small serosal tears were repaired with interrupted 3-0 silk simple sutures.  Good hemostasis was achieved. Inner loop adhesions were lysed.  Dr. Cherly Hensen was then able to deliver the uterus through the surgical wound.  She has adequate exposure at this point to proceed with hysterectomy.  Prior to leaving the operating room, Dr. Cherly Hensen and I discussed closure.  She will use either Novafil or Prolene #1 simple sutures through the fascia and mesh for closure.  We also discussed a short course of antibiotic therapy postoperatively.  We also discussed starting the patient's postoperative diet with only clear liquids.  Dr. Cherly Hensen and I agree on these management strategies.  She will contact me if she requires further assistance.   Velora Heckler, MD, FACS     TMG/MEDQ  D:  05/12/2010  T:  05/13/2010  Job:  [id number]  cc:   Maxie Better, M.D. Fax: [phone]  Electronically Signed by Darnell Level MD on 05/26/2010 06:56:51 AM

## 2010-06-09 ENCOUNTER — Other Ambulatory Visit: Payer: Self-pay | Admitting: Obstetrics and Gynecology

## 2010-06-09 DIAGNOSIS — R2230 Localized swelling, mass and lump, unspecified upper limb: Secondary | ICD-10-CM

## 2010-06-16 ENCOUNTER — Ambulatory Visit
Admission: RE | Admit: 2010-06-16 | Discharge: 2010-06-16 | Disposition: A | Discharge status: Home / Self Care | Payer: PRIVATE HEALTH INSURANCE | Source: Ambulatory Visit | Attending: Obstetrics and Gynecology | Admitting: Obstetrics and Gynecology

## 2010-06-16 DIAGNOSIS — R2230 Localized swelling, mass and lump, unspecified upper limb: Secondary | ICD-10-CM

## 2010-06-20 NOTE — Op Note (Signed)
NAME:  Shannon Mason, Shannon Mason            ACCOUNT NO.:  [id number]   MEDICAL RECORD NO.:  [id number]          PATIENT TYPE:  INP   LOCATION:  9148                          [facility]ILITY:  [facility]   PHYSICIAN:  Ardeth Sportsman, MD     DATE OF BIRTH:  23-Apr-1965   DATE OF PROCEDURE:  DATE OF DISCHARGE:                               OPERATIVE REPORT   SURGEON:  Ardeth Sportsman, MD   ASSISTANT:  Maxie Better, MD   PREOPERATIVE DIAGNOSIS:  Multiple ventral hernias.   POSTOPERATIVE DIAGNOSES:  1. Multiple ventral hernias.  2. Incarcerated omentum 5 x 10 cm region 0.5-4 cm size.   PROCEDURE PERFORMED:  1. Open lysis of adhesions x30 minutes.  2. Open underlay ventral hernia repair with 15 x 20 cm      Parietex/Seprafilm dual-sided mesh.   ANESTHESIA:  1. Epidural anesthesia.  2. Local anesthetic and a field block around all puncture sites.   SPECIMENS:  None.   DRAINS:  None.   ESTIMATED BLOOD LOSS:  Less than 10 mL.   COMPLICATIONS:  None.   INDICATIONS:  Shannon Mason is a pleasant 45 year old female who was  planning her fourth C-section for pregnancy and was noted to have  incisional hernia.  Dr. Cherly Hensen sent the patient to Dr. Lindie Spruce, and the  plan was Dr. Cherly Hensen and Dr. Lindie Spruce would do a combined case whether  there will be a C-section and then a ventral hernia repair.  The  increased risks, benefits, and alternatives of this plan were discussed  and they agreed to proceed.   Unfortunately, Shannon Mason ruptured her membranes earlier today,  required urgent C-section.  After Dr. Cherly Hensen called and I was on call  and asked to evaluate, I was able to do this.  I was able to be  available to help out.  Dr. Cherly Hensen is already doing the C-section.   OPERATIVE FINDINGS:  She had numerous Swiss-cheese herniae most around 1  cm, some 5 mm, 1 around the umbilicus was almost up to 4 cm.  She had a  very elastic decompensated anterior abdominal wall, but no definite  diastasis, hard to tell  if she had a definite diastasis recti.  Her  prior Pfannenstiel incision, while thin,  was intact.  She had numerous  omental adhesions in the ventral hernia incarcerated with omentum, they  were ultimately freed up.   DESCRIPTION OF PROCEDURE:  When I came to the operating room, the  patient already had a C-section.  She was already prepped and draped  with epidural and appropriately sedated.  Dr. Billy Coast scrubbed out and  Dr. Cherly Hensen stayed to help assist the case.  Through the Pfannenstiel  incision, I could see the large fundus, but the closure was adequate,  hemostasis was good.  I could see a moderate amount of greater omentum.  So, I carefully freed this off using careful blunt dissection as well as  focus cautery and occasional sharp dissection to free the greater  omentum off the anterior abdominal wall, which was completely freed off.  I was able to feel through the suprapubic Pfannenstiel  incision prior  full length midline incision that went from xiphoid down to pubis.  About mid way between the xiphoid and the umbilicus, she had two 1-cm  hernia in a Swiss cheese pattern.  She also had a couple of 1 mm  and 1-  cm hernias going closer to the umbilicus, and the umbilicus itself had  about a 4-cm defect.  Her whole abdominal wall was extremely elastic and  stretchy, though it was a little challenging to get a sense of what was  normal fascia and what was not, but the most obvious area was the region  that is described.   Based on this, I felt she would be best served by doing an Magazine features editor.  She already had a prior incision, and I did not want to do an  extra incision around that region.  Therefore, I chose a 15 x 20 cm  Parietex mesh, a dual side with Seprafilm, and placed them with Novofil  interrupted stitches x10 along its ellipsoid border, 4 near the top, 4  near the bottom, and 1 on the side for pretty much even about 5 cm  spacing.  It was secured to the anterior  abdominal wall using a  Endoclose suture passer by doing a small stab incision in the skin  passing it through the  fascia and then out of the wound and then  grabbing the tails and pulling them through.  I had to do it blindly on  the superior ends by but I did it basically onto my hand and using my  fingers to feel around and pushing through the center where I was  holding the fingers and as well as having a large malleable help in  protect the abdomen.  Unfortunately, she had healthy omentum great that  was protecting as well.  Gradually overtime taking bites about 5 cm  external to where the most lateral superior part of the hernia where we  got under coverage.  For a fair amount of the anterior abdominal wall  basically from close to the xiphoid down to pubis.  I pulled up and had  good approximation were laid well with minimal tension.  The lower 2  areas were actually brought below and in taking a couple of centimeters  inferior to the fascia from the inferior edge of the Pfannenstiel  incision.  The great omentum was allowed to lay well.  The edges of the  mesh stayed up well and I inspected and they do not require any tacking,  they laid well, nice and flat.  There was no exposure of rough mesh.  It  is fortunate that the Seprafilm had a 5 mm overlap intact.  Again the  stitches were 5 cm apart at the most.  Fascia distally was tied down,  mesh laid well.   The wound was reapproximated using 2-0 Vicryl stitch vertically on the  posterior rectus fascia down to the suprapubic region.  The bladder was  tucked in.  The  prior Pfannenstiel transverse anterior rectus fascia  was reapproximated using 0 Vicryl stitch in running fashion using  stitches x2 with occasional locking.  The wound was irrigated copiously  with saline and Dr. Cherly Hensen closed the skin staples on the Pfannenstiel  incision and Steri-strips on the puncture site x10.   The patient was sent to recovery room in stable  condition.   We discussed postoperative findings and preoperative plan with the  patient's husband who is  at the bedside with his new child.  Questions  answered and he expressed understanding and appreciation.      Ardeth Sportsman, MD  Electronically Signed     SCG/MEDQ  D:  06/25/2008  T:  06/26/2008  Job:  [id number]   cc:   Cherylynn Ridges, M.D.  [address], [address]  [place]  [place] [address]   Maxie Better, M.D.  Fax: [phone]

## 2010-06-20 NOTE — Discharge Summary (Signed)
NAME:  JACKOLYN, GERON            ACCOUNT NO.:  [id number]   MEDICAL RECORD NO.:  [id number]          PATIENT TYPE:  INP   LOCATION:  9148                          [facility]   PHYSICIAN:  Maxie Better, M.D.DATE OF BIRTH:  17-Aug-1965   DATE OF ADMISSION:  06/24/2008  DATE OF DISCHARGE:  06/27/2008                               DISCHARGE SUMMARY   ADMISSION DIAGNOSES:  1. Previous cesarean section x3, desires sterilization.  2. Oligohydramnios.  3. Intrauterine gestation at 38+ weeks.  4. Ventral hernias.  5. Pregnancy induced hypertension  6. Idiopathic proteinuria   DISCHARGE DIAGNOSES:  1. Term gestation, delivered.  2. Previous cesarean section x3.  3. Oligohydramnios.  4. Desires sterilization.  5. Multiple ventral hernia.  6. Incarcerated omentum.  7. Pregnancy induced hypertension  8. Idiopathic proteinuira   PROCEDURE:  Repeat cesarean section Sharl Ma hysterotomy, modified Pomeroy  tubal ligation, open lysis of adhesions, open underlying ventral hernia  repair with a 15- x 20-cm Parietex/Seprafilm dual-side mesh( Dr. Michaell Cowing).   HISTORY OF PRESENT ILLNESS:  A 45 year old, gravida 4, para 3, married  black female at 24+ weeks' gestation with previous cesarean section x3,  who was scheduled for a repeat cesarean section and tubal ligation and  hernia repair on Jul 01, 2008, but who presented with oligohydramnios  and premature contractions.  The patient was also scheduled to have  hernia repair which has been evaluated by Dr. Lindie Spruce during her pregnancy  due to abdominal pain.  The patient on office visit for contractions was  found to have oligohydramnios and biophysical profile was 8/8.  Decision  was then made to proceed with her delivery.  Consent was signed for both  planned procedures.  The patient desires permanent sterilization as  well.   HOSPITAL COURSE:  The patient was admitted to [facility].  She was  taken to the operating room where she  underwent the above procedures  both by Dr. Cherly Hensen and Dr. Michaell Cowing, the general surgeon.  Please see the  dictated operative notes of both respective surgeons regarding the  specific details.  The patient delivered a live female 7 pounds 3  ounces, Apgars of 9 and 10.  Normal tubes and ovaries was noted at the  time of surgery, multiple anterior abdominal wall ventral hernia sites  were noted, as well as omentum adherent to that anterior abdominal wall.  Her history is notable for idiopathic proteinuria followed by Dr.  Briant Cedar. She developed  elevation of her blood pressures in the last  couple of weeks of her pregnancy requiring antihypertensive medication.  PIH labs were normal.  The patient was continued on her labetalol  postoperatively.  She had issues with diffuse itching which subsequently  resolved.  It was found out to be secondary to the bed sheets.  The  patient by postop day #3 had bowel movements x2, passing flatus,  tolerating a regular diet.  Her incisions from the hernia standpoint was  intact, from the cesarean standpoint had staples, no erythema,  induration, or exudate.  Her CBC on postop day #1 showed a hemoglobin of  11.2, hematocrit 33.6,  white count of 10.5, platelet count of 194,000.  She had admission PIH labs that were normal except for elevation of uric  acid.  The patient on postop day #3 had blood pressure ranging from 110-  138 over 72-100.  Her labetalol was decreased to 200 mg p.o. b.i.d.  She  was otherwise doing well and deemed well to be discharged home.  Her  extremities had 1-2+ edema, ankle greater than the legs   Disposition therefore is home.   CONDITION:  Stable.   DISCHARGE MEDICATIONS:  1. Labetalol 200 mg p.o. b.i.d.  2. Tylox 1-2 tablets every 3-4 hours p.r.n. pain.  3. Motrin 800 mg 1 every 6-8 hours p.r.n. pain.  4. Prenatal vitamins 1 p.o. daily.   She will follow up with Dr. Michaell Cowing in few weeks.  She will call Dr.  Briant Cedar for  earlier followup with respect to her idiopathic  proteinuria and to change her blood pressure medication.  Staple removal  in the office on Thursday and for 6 weeks postop otherwise.  Discharge  instructions per the postpartum booklet given as well as printed out  instructions from Dr. Michaell Cowing' office regarding hernia management.      Maxie Better, M.D.  Electronically Signed     [place]/MEDQ  D:  06/27/2008  T:  06/28/2008  Job:  [id number]

## 2010-06-20 NOTE — Op Note (Signed)
NAME:  Shannon Mason, Shannon Mason            ACCOUNT NO.:  [id number]   MEDICAL RECORD NO.:  [id number]          PATIENT TYPE:  INP   LOCATION:  9372                          FACILITY:  [facility]   PHYSICIAN:  Maxie Better, M.D.DATE OF BIRTH:  1966/01/27   DATE OF PROCEDURE:  06/24/2008  DATE OF DISCHARGE:                               OPERATIVE REPORT   PREOPERATIVE DIAGNOSES:  Previous cesarean section x3, intrauterine  gestation at 38+ weeks, oligohydramnios, desires sterilization,  umbilical/ventral hernia.   PROCEDURE:  Repeat cesarean section and modified Pomeroy tubal ligation.   POSTOPERATIVE DIAGNOSES:  Previous cesarean section x3, intrauterine  gestation at 38+ weeks, oligohydramnios, desires sterilization,  umbilical/ventral hernia.   ANESTHESIA:  Epidural.   SURGEON:  Maxie Better, MD   ASSISTANT:  Lenoard Aden, MD   PROCEDURE:  Under adequate epidural anesthesia, the patient was placed  in the supine position with a left lateral tilt.  She was sterilely  prepped and draped in a fashion up to her xiphoid.  Due to the planned  hernia repair by Dr. Michaell Cowing, indwelling Foley catheter was sterilely  placed.  A 0.25% Marcaine was injected along the previous Pfannenstiel  skin incision site.  A Pfannenstiel skin incision was made just above  the previous scar, carried down to the rectus fascia.  The rectus fascia  was opened transversely.  Rectus fascia was then bluntly and sharply  dissected off the rectus muscle in superior and inferior fashion.  Rectus muscle was split already separated in midline.  Parietal  peritoneum was opened.  As the rectus fascia was being sharply  dissected, the parietal peritoneum was then further opened and the  vesicouterine peritoneum was opened transversely.  A curvilinear low  transverse uterine incision was then made and extended with bandage  scissors.  Subsequent delivery of a live female from the left occiput  transverse position  was accomplished.  Baby had meconium-stained fluid,  delivered without incident.  Baby was bulb suctioned on the abdomen.  The cord was clamped and cut.  Baby was transferred to the awaiting  pediatrician, who assigned Apgars of 9 at ten and 1 at five minutes.  The placenta which was anterior and posterior was manually removed  without incident.  Uterine cavity was cleaned of debris.  Uterine  incision had no extension, was closed in 2 layers.  The first layer with  0 Monocryl running locked stitch and second layer with imbricated using  0 Monocryl suture.  Attention was then turned to the fallopian tubes,  which were both normal.  Both ovaries were normal.  The midportion of  both fallopian tubes were identified.  The underlying mesosalpinx was  opened.  The proximal and distal portion of the tube was tied with 0  chromic sutures x2 and the intervening segments of both tubes were  removed.  Abdomen was then copiously irrigated, suctioned of debris.  At  that point, Dr. Michaell Cowing was called.  Please see his dictation regarding  the hernia repair.  After the hernia repair, the parietal peritoneum was  closed with 2-0 Vicryl.  The rectus fascia was closed with  0 Vicryl x2.  The subcutaneous area was irrigated, small bleeders cauterized,  interrupted 2-0 plain sutures placed and the skin approximated using  staples.  Dr. Michaell Cowing helped with the closure of the abdomen.  The  specimen was portion of right and left fallopian tubes sent to  Pathology.  Placenta was  not sent.  Estimated blood loss 600 mL.  Intraoperative fluid 1400 mL.  Urine output 325 mL.  Sponge and instrument counts x2 was correct.  Complications none.  Weight of the baby was 7 pounds 3 ounces.  The  patient tolerated the procedure well and was transferred to recovery  room in stable condition.      Maxie Better, M.D.  Electronically Signed     [place]/MEDQ  D:  06/25/2008  T:  06/25/2008  Job:  [id number]

## 2010-06-20 NOTE — Consult Note (Signed)
Shannon Mason, Shannon Mason            ACCOUNT NO.:  [id number]   MEDICAL RECORD NO.:  [id number]          PATIENT TYPE:  INP   LOCATION:  3312                         FACILITY:  [facility]   PHYSICIAN:  Wendi Snipes, MD DATE OF BIRTH:  18-Oct-1965   DATE OF CONSULTATION:  DATE OF DISCHARGE:                                 CONSULTATION   CARDIOLOGIST:  None.   PRIMARY DOCTOR:  Dr. Paulino Rily with [facility]   CHIEF COMPLAINT:  Shortness of breath.   HISTORY OF PRESENT ILLNESS:  This is a 45 year old [place] American  female with a recent C-section on Jun 24, 2008, here with progressive  shortness of breath.  She experienced shortness of breath soon after  giving birth to her fourth child and now reports increasing paroxysmal  nocturnal dyspnea for the past few weeks.  Over the past week, she has  progressed in not being able to lay flat or sleeping.  She states that  she has never experienced these symptoms before.  She also complains of  constant chest pain for approximately the past week.  She otherwise does  not report any recent illnesses, though she thinks that she had a minor  fever 2 days ago.  She denies palpitations, syncope, presyncope,  increased lower extremity edema.   PAST MEDICAL HISTORY:  1. Hypertension.  2. Sickle cell trait.  3. Chronic kidney disease.  4. Status post C-section x4.   ALLERGIES:  NO KNOWN DRUG ALLERGIES.   MEDICATIONS:  Labetalol 100 twice daily.   SOCIAL HISTORY:  The patient lives in [place] with her husband and  her four children.  She works at the [employer].  She has never  smoked.   FAMILY HISTORY:  Father has congestive heart failure.   REVIEW OF SYSTEMS:  All 14 systems were reviewed and were negative  except as mentioned in detail in the HPI.   PHYSICAL EXAMINATION:  VITAL SIGNS:  Blood pressure is 144/91,  respiratory rate is 24, pulses 105 and regular.  She is satting 96% on 2  liters nasal cannula.   GENERAL:  She is a 45 year old [place] American  female, appearing stated age in no acute distress.  HEENT:  Moist mucous membranes.  Pupils are equal, round, react to light  accommodation.  Anicteric sclera.  NECK:  She has approximately 12-14 cm jugular venous distention.  No  thyromegaly.  CARDIOVASCULAR:  Regular rate and rhythm with a laterally displaced PMI.  LUNGS:  She had bilateral rales to the mid lungs.  ABDOMEN:  Nontender, nondistended.  Positive bowel sounds.  No masses.  EXTREMITIES:  Trace pitting edema, no clubbing or cyanosis.  NEUROLOGIC:  Alert and x3.  Cranial nerves II-XII grossly intact.  No  focal neurologic deficits.  SKIN:  Warm, dry and intact.  No rashes.  PSYCHIATRIC:  Mood and affect are appropriate.   RADIOLOGY:  Chest x-ray showed small bilateral pleural effusions and  mild interstitial edema.  EKG showed a rate of 111.  Rhythm is sinus  tachycardia.  She has T-wave inversions laterally with possibly LVH.   LABORATORY REVIEW:  White blood cell count 5.2, hematocrit is 36,  platelet count 425, potassium is 3.3, creatinine 0.8.  Her BNP is  approximately 1500.  Her troponin is 0.14 with an unremarkable CK-MB.   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  Please continue to treat her volume  overload as you are with IV diuresis.  Check echocardiogram, and she may  take ACE inhibitors as she is not currently breast feeding.  Treatment  for peripartum cardiomyopathy is mainly supportive, and in line with  routine therapy for acute systolic congestive heart failure.  However,  she will need long-term follow-up as an  outpatient.      Wendi Snipes, MD  Electronically Signed     [facility]/MEDQ  D:  07/19/2008  T:  07/20/2008  Job:  [id number]

## 2010-06-20 NOTE — Discharge Summary (Signed)
Shannon Mason, Shannon Mason            ACCOUNT NO.:  [id number]   MEDICAL RECORD NO.:  [id number]          PATIENT TYPE:  INP   LOCATION:  4713                         FACILITY:  [facility]   PHYSICIAN:  Hollice Espy, M.D.DATE OF BIRTH:  17-Feb-1965   DATE OF ADMISSION:  07/19/2008  DATE OF DISCHARGE:  07/23/2008                               DISCHARGE SUMMARY   PRIMARY CARE Arnika Larzelere:  Dr. Johnn Hai.   NEPHROLOGIST:  Dyke Maes, MD   NEW CARDIOLOGIST:  Lyn Records, M.D., [facility]   DISCHARGE DIAGNOSES:  1. Postpartum cardiomyopathy.  2. Systolic congestive heart failure with an ejection fraction of 15%.  3. Hypotension secondary to newly started medications.  4. History of proteinuria.  5. History of gestational diabetes.  6. History of C-section x1.  7. History of mild asthma and previous history of multiple ventral      hernia.  8. History of herpes simplex virus.  9. History of sickle cell trait.  10.History of migraine.  11.History of lysis of adhesions.  12.History of iron-deficiency anemia.  13.History of prior history of elevated blood pressure reading.   DISCHARGE MEDICATIONS:  The patient will continue on prenatal vitamins  and p.r.n. oxycodone.  She was previously on labetalol with medicines  being stopped.  New medicines are  1. Lasix 40 p.o. daily.  2. Altace 2.5 p.o. daily.  3. Coreg 3.125 p.o. b.i.d., this medication may be increased to 6.25      p.o. b.i.d. at the discretion of Dr. Katrinka Blazing following the followup      visit if her blood pressure can tolerate it.  At this time, she      needs to stay on 3.125 p.o. b.i.d.  4. K-Dur 20 mEq daily.  5. Aspirin 325 p.o. daily.   DISCHARGE DIET:  Heart-healthy diet.   ACTIVITIES:  Increase activity slowly.  The patient was having [facility] follow up with CHF protocol education and assistance starting  the day after discharge.  The patient also recommends checking her  weight daily  and to watch her sodium intake limiting it to 2 g.   HOSPITAL COURSE:  The patient is a 45 year old African American female  with past medical history of elevated blood pressure reading, pregnancy-  induced hypertension, proteinuria, gestational diabetes who was is  status post a recent C-section on Jun 24, 2008 presented with  progressive shortness of breath.  She stated that she experienced  shortness of breath soon after giving birth to her fourth child.  She  was brought into the emergency room, a BNP was done, was noted to be  1500.  It was suspected that she had a postpartum cardiomyopathy.  There  was a question of myocarditis with complaints of some additional pain.  Chest x-ray showed bilateral pleural effusion, mild edema, she had some  T-wave inversions.  Dr. Katrinka Blazing from Cardiology was consulted.  An  echocardiogram was ordered which showed normal valvular function, but  more concerning was severely depressed ejection fraction of 15%.  Dr.  Katrinka Blazing followed the patient.  Other etiologies such as pulmonary embolism  and acute coronary syndrome were ruled out and she was started on Lasix  for diuresis.  She tolerated this well.  Dr. Katrinka Blazing recommended continued  symptomatic treatment.  He put her on ACE inhibitor when it was  established that the patient would not be breastfeeding, he added beta-  blocker, diuretic, aspirin and with some blood pressure control.  Her  prognosis remained guarded in that a third of patients with postpartum  cardiomyopathy and a severely depressed EF would worsen, a third would  improve, and a third would remain in their current clinical state.  Over  the next several days, the patient continued to feel better.  She was  started on cardiac rehab phase I and II and was able to ambulate.  She  was tried on 80 of Lasix p.o. b.i.d. and as well as Coreg 6.25, had  episodes of hypotension, blood pressure in the 80s.  She got very  lightheaded from this.  Her  Lasix and Coreg were adjusted accordingly  and her systolic blood pressure as of feedings June 18 was right at 100.  I discussed this with Dr. Katrinka Blazing and the plan will be, he preferred to  keep her on 6.25 p.o. b.i.d. if her blood pressure tolerating, but she  has not been able to tolerate this.  Dr. Katrinka Blazing will see the patient  again on July 1 at 1 p.m. in his office.  At  that time, he will recheck  a blood pressure, in the meantime [facility] with CHF protocol  education and assistance has been provided and we will start following  the patient one day post discharge.  She has been given information on  these issues and this has been discussed with her and her husband.  The  patient's overall disposition has improved.  On initial presentation,  activity will be slowly increase.  She has been discharged on the above  medications.  She will be discharged to home.  She will follow up with  her PCP, Dr. Laurine Blazer in approximately 2-3 weeks.      Hollice Espy, M.D.  Electronically Signed     [facility]/MEDQ  D:  07/23/2008  T:  07/24/2008  Job:  [id number]   cc:   Lyn Records, M.D.  Dr. Johnn Hai

## 2010-06-20 NOTE — H&P (Signed)
NAME:  Shannon Mason, Shannon Mason            ACCOUNT NO.:  [id number]   MEDICAL RECORD NO.:  [id number]          PATIENT TYPE:  INP   LOCATION:  3312                         FACILITY:  [facility]   PHYSICIAN:  Ramiro Harvest, MD    DATE OF BIRTH:  July 15, 1965   DATE OF ADMISSION:  07/19/2008  DATE OF DISCHARGE:                              HISTORY & PHYSICAL   PRIMARY CARE PHYSICIAN:  Emeterio Reeve, MD of [facility].   NEPHROLOGIST:  Dr. Briant Cedar of [facility].   HISTORY OF PRESENT ILLNESS:  Shannon Mason is a 45 year old at  [place] female recently discharged on Jun 27, 2008, with  oligohydramnios and multiple ventral hernias and incarcerated omentum  status post C-section and lysis of adhesions and ventral hernia repair  Jun 24, 2008, also with a history of asthma, mesenteric vein laceration  requiring emergency expiratory laparotomy for hemoperitoneum and repair  of laceration in December 2007 and also with splenic capsular tear done  during surgery which was controlled then, who presents to the ED with  shortness of breath times 25 days since discharge with a  lightheadedness, palpitations and orthopnea.  The patient also  complained of a squeezing midsternal nonradiating chest pain described  as crushing sensation which has been ongoing for the past 1-1/2 weeks  and some generalized weakness, significant orthopnea and palpitations.  The patient states that a mid sternal chest pain was described as a  crushing pain, is constant in nature and occurs at rest.  The patient  does endorse some generalized weakness, some diarrhea and a temperature  of 101.4 three days prior to admission which has since resolved and  questionable chills.  The patient denies any syncope, no diaphoresis, no  nausea, no vomiting, no change in abdominal discomfort since surgery.  No cough, no chills, no focal neurological symptoms.  No other  associated symptoms.  The patient was  seen in the emergency department.  A BNP which was obtained came back elevated at 1543.  A CBC which was  obtained had a white count of 5.2, hemoglobin 11.8, hematocrit 36.3,  platelet count of 425 and ANC of 2.5.  Basic metabolic profile with a  potassium of 3.3, otherwise was within normal limits.  Cardiac enzymes  with troponin elevated at 0.14, D-dimer of 1.56.  Chest x-ray which was  done showed interstitial prominence raising the question of interstitial  edema.  There was bibasilar atelectasis/infiltrate with small bilateral  pleural effusions.  The patient was given nitroglycerin IV with some  relief as well as some Lasix.  We were called to admit the patient for  further evaluation and management.   ALLERGIES:  EGGS, POULTRY AND LATEX.   PAST MEDICAL HISTORY:  1. Mild asthma.  2. Proteinuria  3. Gestational diabetes.  4. C-section x4.  5. Oligohydramnios.  6. Multiple ventral hernias.  7. Incarcerated omentum.  8. Pregnancy induced hypertension.  9. Status post open lysis of adhesions Jun 25, 2008.  10.Status post open under late ventral hernia repair per Dr. Michaell Cowing,      Jun 25, 2008.  11.Status post repeat C-section  and a modified primary tubal ligation      Jun 24, 2008.  12.History of migraines.  13.Sickle cell trait.  14.History of HSV.  15.History of cryosurgery to the cervix.  16.History of mesenteric vein laceration status post expiratory      laparotomy for hemoperitoneum and repair laceration after a      percutaneous renal biopsy and splenic capsular tear December 2007.  17.Iron-deficiency anemia.  18.Prior history of hypertension.   HOME MEDICATIONS.:  1. Prenatal vitamins  2. Labetalol 100 mg p.o. b.i.d.  3. Oxycodone 5 mg p.o. q.4 h p.r.n. pain.   SOCIAL HISTORY:  The patient is married, no tobacco use.  No alcohol  use.  No IV drug use.   FAMILY HISTORY:  Mother deceased from breast cancer.  Father alive age  3, currently in the hospital in  [place] with heart failure.  One  daughter with sickle cell, has 12 siblings, one sister deceased age 62  from breast cancer.  Another one with a breast cancer and kidney disease  with an aunt who is on hemodialysis.   REVIEW OF SYSTEMS:  As per HPI, otherwise negative.   PHYSICAL EXAMINATION:  VITAL SIGNS:  Temperature 98.1, blood pressure  143/101 down to 144/91, pulse of 106, respirations 24, satting 96% on  room air.  GENERAL:  Patient no apparent distress.  HEENT:  Normocephalic, atraumatic.  Pupils equal, round and reactive  light and accommodation.  Extraocular movements intact.  Oropharynx is  clear.  No lesions, no exudates.  NECK:  Supple.  No lymphadenopathy.  RESPIRATORY:  Bibasilar crackles.  CARDIOVASCULAR:  Tachycardiac, regular rhythm.  No murmurs, rubs or  gallops.  ABDOMEN:  Soft, positive bowel sounds.  Mild discomfort/tenderness to  palpation in the right lower quadrant.  No rebound or guarding.  Nondistended.  EXTREMITIES:  No clubbing or cyanosis, 1+ bilateral lower extremity  edema left greater than right.  NEUROLOGICAL:  The patient is alert and oriented x3.  Cranial nerves II-  XII are grossly intact.  No focal deficits.   ADMISSION LABORATORY DATA:  D-dimer of 1.56.  A CK of 115, CK-MB 2.6,  troponin I 0.14.  CBC white count 5.2, hemoglobin 11.8, hematocrit 36.3,  platelets of 425, ANC of 2.5, sodium of 143, potassium 3.3, chloride  108, bicarb 28, BUN 7, creatinine 0.82, glucose of 108, calcium of 9.1,  D-dimer of 1.56, BNP of 1543.  EKG with sinus tachycardia with T-wave  inversions in leads V4-V6.  Chest x-ray with interstitial prominence  raises the question of interstitial edema and bibasilar  atelectasis/infiltrate with small bilateral pleural effusions.   ASSESSMENT AND PLAN:  Shannon Mason is a 45 year old female  recently discharged from the hospital with oligohydramnios status post C-  section, multiple ventral hernias and incarcerated  omentum status post  lysis of adhesions and ventral hernia repair Jun 24, 2008, a history of  mesenteric vein laceration in the past status post exploratory  laparotomy for hemoperitoneum and repair, history of asthma, presented  to the ED with shortness of breath and chest pain.   PROBLEMS:  1. Shortness of breath, likely secondary to CHF versus PE (the patient      recently pregnant) status post C-section Jun 24, 2008, status post      abdominal surgery.  Pneumonia is unlikely with a negative chest x-      ray, normal white count versus acute coronary syndrome.  Will admit      the patient to  the step-down unit.  Will cycle cardiac enzymes q.8      h x3.  Check a TSH.  Check a magnesium.  Check a hepatic panel.      Check a fasting lipid panel.  CT angio of the chest to rule out PE.      Check a 2-D echo to evaluate LV dysfunction.  Place on oxygen,      nitroglycerin, morphine, aspirin, labetalol strict I's and O's,      daily weights.  Will diurese with IV Lasix and Cardiology consult      for further evaluation and management.  2. Congestive heart failure, questionable etiology, acute coronary      syndrome versus PE versus a postpartum cardiomyopathy.  Will cycle      cardiac enzymes q.8 h x3.  Check a TSH.  Check a magnesium.  Check      a 2-D echo.  Check a repeat EKG in the morning.  Check a fasting      lipid panel.  Check a CT angio of the chest.  Strict I's and O's,      daily weights.  Place on O2, Lasix, beta-blocker, aspirin,      nitroglycerin.  Cardiology consult for further evaluation and      recommendations.  3. Hypokalemia.  Check a magnesium level and replete.  4. Elevated D-dimer.  Check a CT angio to rule out PE.  5. Elevated troponins, questionable etiology, might be secondary to      LVH versus acute coronary syndrome versus secondary to CHF.  Cycle      cardiac enzymes.  Check an EKG.  Check a fasting lipid panel.      Check a TSH.  Check a magnesium level.   Place on O2 nitroglycerin,      morphine and beta-blocker.  Cardiology consult.  6. Asthma.  Nebs as needed.  7. Iron-deficiency anemia.  Follow H&H.  8. Oligohydramnios/multiple ventral hernia/incarcerated omentum status      post C-section and open LOA and ventral hernia repair Jun 25, 2008.  9. Hypertension.  Continue home dose Labetalol.  10.Prophylaxis.  Protonix for GI prophylaxis.  Lovenox for DVT      prophylaxis.   It has been a pleasure taking care of Shannon Mason.      Ramiro Harvest, MD  Electronically Signed     DT/MEDQ  D:  07/19/2008  T:  07/20/2008  Job:  [id number]   cc:   Emeterio Reeve, MD  Dyke Maes, M.D.

## 2010-06-23 NOTE — H&P (Signed)
NAME:  Shannon Mason, Shannon Mason            ACCOUNT NO.:  [id number]   MEDICAL RECORD NO.:  [id number]          PATIENT TYPE:  OIB   LOCATION:  2305                         FACILITY:  [facility]   PHYSICIAN:  Dyke Maes, M.D.DATE OF BIRTH:  1965-05-09   DATE OF ADMISSION:  01/24/2006  DATE OF DISCHARGE:                              HISTORY & PHYSICAL   REASON FOR ADMISSION:  Abdominal pain post renal biopsy.   HISTORY OF PRESENT ILLNESS:  This is a 45 year old black female who was  brought in today initially under outpatient observation to undergo renal  biopsy for worsening proteinuria.  During the biopsy, when the second  biopsy specimen was taken, she did complain of pain that seem to  decrease in nature, but when we were finished with the biopsy and rolled  her over, she complained of severe abdominal pain.  She was given  morphine, and a flat plate of the abdomen was performed which showed no  perforation.  Systolic blood pressure did decrease to the 90s but did  improve with IV fluids.  An i-STAT hemoglobin was 12.2 and hemoglobin  sent down to lab was 11.4.  Because of the concern about either a  significant retroperitoneal bleed or perforation, she is being admitted  to 2300 for further observation and evaluation.   PAST MEDICAL HISTORY:  Significant for:  1. History of mild asthma.  2. Proteinuria of uncertain etiology.  3. History of gestational diabetes.   ALLERGIES:  1. EGGS.  2. POULTRY.  3. LATEX ALLERGY.   MEDICATIONS:  Include Micardis 80 mg a day.   SOCIAL HISTORY:  Nonsmoker, nondrinker.  She is married.  She has 2  children with the youngest having sickle cell disease.   FAMILY HISTORY:  Her mother died of unclear reasons.  Father is healthy  at the age of 80.  She has one aunt who is on hemodialysis in [place]  at the age of 25.   REVIEW OF SYSTEMS:  Up until today, appetite had been good.  She had no  shortness of breath, PND, or orthopnea.  No chest  pains or chest  pressures.  No recent change in bowel habits.  No arthritic complaints,  no neuropathic symptoms.  Overall, review of systems was negative prior  to this admission, but currently her biggest complaint is of abdominal  pain.   PHYSICAL EXAMINATION:  VITAL SIGNS:  Blood pressure, currently is  118/68, pulse is 74, temperature is 98.5.  GENERAL:  A 45 year old black female in moderate distress secondary to  abdominal pain.  HEENT:  Sclerae anicteric.  Extraocular muscles are intact.  LUNGS:  Clear to auscultation.  HEART:  Regular rate and rhythm without murmur, rub, or gallop.  ABDOMEN:  Bowel sounds are present but decreased.  She is diffusely  tender with rebound.  No hepatosplenomegaly.  EXTREMITIES:  No clubbing, cyanosis, or edema.  NEUROLOGIC EXAM:  Cranial nerves intact.  Motor and sensory intact.  No  asterixis.   IMPRESSION:  1. Severe abdominal pain, post renal biopsy.  I was initially      concerned above a bleed  given the fact that her conjunctivae were      quite pale, and her blood pressure was low.  Her hemoglobin is      relatively stable at 11.4.  Perforation is also a possibility, and      we will get a stat computed tomography scan of the abdomen to rule      this out.  If computed tomography scan does suggest perforation, we      will get emergent surgical consult.  2. Proteinuria plan.  Stat computed tomography scan of the abdomen.  3. Continue intravenous fluids.  4. We will check her chemistry profile and closely follow her      hemoglobin.  5. Further evaluation will be dictated by the computed tomography scan      of the abdomen.           ______________________________  Dyke Maes, M.D.     MTM/MEDQ  D:  01/24/2006  T:  01/25/2006  Job:  [id number]

## 2010-06-23 NOTE — Op Note (Signed)
NAME:  Shannon Mason, Shannon Mason            ACCOUNT NO.:  [id number]   MEDICAL RECORD NO.:  [id number]          PATIENT TYPE:  OIB   LOCATION:  2305                         FACILITY:  [facility]   PHYSICIAN:  Dyke Maes, M.D.DATE OF BIRTH:  [birth year]   DATE OF PROCEDURE:  01/24/2006  DATE OF DISCHARGE:                               OPERATIVE REPORT   PROCEDURE PERFORMED:  Left kidney percutaneous renal biopsy.   INDICATIONS FOR PROCEDURE:  Proteinuria.   DESCRIPTION OF PROCEDURE:  The risks of the procedure were explained to  the patient and informed consent obtained.  The left flank was prepped  and draped in sterile fashion.  A local anesthesia was obtained with 15  mL of 1% lidocaine.  Four passes were made with a 15-gauge biopsy needle  with return of two cores of fatty tissue and two cores of renal tissue.  The patient tolerated the procedure well.           ______________________________  Dyke Maes, M.D.     MTM/MEDQ  D:  01/24/2006  T:  01/25/2006  Job:  [id number]

## 2010-06-23 NOTE — Consult Note (Signed)
NAME:  Shannon Mason, Shannon Mason                        ACCOUNT NO.:  [id number]   MEDICAL RECORD NO.:  [id number]                   PATIENT TYPE:  EMS   LOCATION:  ED                                   FACILITY:  [facility]   PHYSICIAN:  Corinna L. Lendell Caprice, MD             DATE OF BIRTH:  06-11-1965   DATE OF CONSULTATION:  01/27/2003  DATE OF DISCHARGE:                                   CONSULTATION   CONSULTING PHYSICIAN:  Carren Rang, M.D.   REASON FOR CONSULTATION:  Vertigo, evaluate for admission.   IMPRESSIONS AND RECOMMENDATIONS:  Acute vertigo probably secondary to  vestibulitis.  There are no signs of stroke and her CAT scan is negative.  Her symptoms have resolved with Compazine and meclizine and the patient may  be discharged to home with outpatient follow-up.  I have written a  prescription for meclizine as needed.   HISTORY OF PRESENT ILLNESS:  [name] is a 45 year old black female with no  past medical history who presents to the emergency room with nausea,  headache, vertigo.  She also had a PO2 that was 68 on room air and was  evaluated for PE as well.  Currently, the patient has no further vertigo or  nausea and she has been able to ambulate with the nurse here in the  emergency room.   PAST MEDICAL HISTORY:  None.   MEDICATIONS:  None.   SOCIAL HISTORY:  The patient is here with her boyfriend.   FAMILY HISTORY:  Noncontributory.   REVIEW OF SYSTEMS:  As above; otherwise negative.   PHYSICAL EXAMINATION:  GENERAL:  The patient is sleepy but arousable.  HEENT:  Normocephalic, atraumatic.  Pupils equal, round, and reactive to  light.  Tympanic membranes are clear.  She has no nystagmus.  Moist mucous  membranes.  NECK:  Supple.  No carotid bruits.  LUNGS:  Clear to auscultation bilaterally without wheezes, rhonchi, or  rales.  CARDIOVASCULAR:  Regular rate and rhythm without murmurs, gallops, rubs.  ABDOMEN:  Normal bowel sounds, soft, nontender, nondistended.  GENITOURINARY AND RECTAL:  Deferred.  EXTREMITIES:  No clubbing, cyanosis, or edema.  NEUROLOGIC:  Somnolent but oriented x3.  Cranial nerves are intact.  Motor  strength 5/5.  Deep tendon reflexes intact.  Romberg negative.   LABORATORY DATA:  A pH is 7.431, PCO2 46, PO2 68.  CBC is significant for a  hemoglobin of 10.9, hematocrit of 32.8.  Complete metabolic panel is  essentially normal.  Lipase normal.  CPK is 204, MB fraction is 3.8,  troponin less than 0.1.  Urine pregnancy negative.  Urine drug screen  negative.  UA negative.  CT of the head negative.  CT of the chest negative  for PE or DVT but there is dependent atelectasis.  Corinna L. Lendell Caprice, MD    CLS/MEDQ  D:  01/27/2003  T:  01/27/2003  Job:  [id number]

## 2010-06-23 NOTE — Consult Note (Signed)
NAME:  Shannon Mason, Shannon Mason            ACCOUNT NO.:  [id number]   MEDICAL RECORD NO.:  [id number]          PATIENT TYPE:  OIB   LOCATION:  2305                         FACILITY:  [facility]   PHYSICIAN:  Cherylynn Ridges, M.D.    DATE OF BIRTH:  12-06-1965   DATE OF CONSULTATION:  01/24/2006  DATE OF DISCHARGE:                                 CONSULTATION   Dyke Maes, M.D.  [address]  [place], [place] [address].  Dear Dr. Briant Cedar:   Thank you for asking me to see [name], a very pleasant 45 year old  female who recently underwent a left renal biopsy and now has  peritonitis.   HISTORY OF PRESENT ILLNESS:  The patient has had proteinuria since her  most recent pregnancy which was about 4 months ago when she delivered  and actually had hypertension related to that which, has had to be  treated recently with Micardis.  Only previous history is that of having  had proteinuria from before, but has not been previously diagnosed.  She  has no evidence of renal failure currently, but was being worked up for  the proteinuria.   PAST MEDICAL HISTORY:  significant for just previous C-sections times  three.  She has had no other operations.   ALLERGIES:  She has no known drug allergies.   PHYSICAL EXAMINATION:  GENERAL:  She has recently been transferred to  the ICU.  She appears to be in acute distress.  She can hardly breathe  because of severe abdominal pain.  VITAL SIGNS:  Respiratory rate between 25 and 30.  Her saturations are  good.  HEENT: She has got blurry and tearful eyes that are injected with some  scleral injection.  ABDOMEN:  No bowel sounds, diffuse tenderness to cough, tap or shake  tenderness.  She has severe pain with coughing.  White count is  currently pending, previous one was 11,000 without much of a left shift.  Hemoglobin has been stable.  CT scan demonstrates a large amount of free  fluid in the abdomen on the right and left side and the pelvis.  Not as  much  blood around the left kidney as she would expect from the left  renal biopsy.   IMPRESSION:  My impression is that she probably has a small bowel  perforation from the renal biopsy, perhaps a colonic perforation, and we  need to explore her to repair the area of perforation, responsible for  the peritonitis.  Will take her to the operating room as soon as  possible for exploration.  The risk and benefits have been explained to  the patient and her husband.  They want to proceed as she is in severe  pain.  I have communicated this need for surgery to Dr. Briant Cedar  indirectly.      Cherylynn Ridges, M.D.  Electronically Signed    JOW/MEDQ  D:  01/24/2006  T:  01/25/2006  Job:  [id number]

## 2010-06-23 NOTE — Op Note (Signed)
NAME:  Shannon Mason, Shannon Mason            ACCOUNT NO.:  [id number]   MEDICAL RECORD NO.:  [id number]          PATIENT TYPE:  INP   LOCATION:  9102                          FACILITY:  [facility]   PHYSICIAN:  Maxie Better, M.D.DATE OF BIRTH:  1965-06-11   DATE OF PROCEDURE:  09/21/2005  DATE OF DISCHARGE:                                 OPERATIVE REPORT   PREOPERATIVE DIAGNOSIS:  Previous cesarean section x2, term gestation,  pregnancy induced hypertension, class A1 gestational diabetes.   POSTOPERATIVE DIAGNOSIS:  Previous cesarean section x2, term gestation,  pregnancy induced hypertension, class A1 gestational diabetes.   PROCEDURE:  Repeat cesarean section Kerr hysterotomy.   ANESTHESIA:  Spinal.   SURGEON:  Maxie Better, M.D.   ASSISTANT:  Lenoard Aden, M.D.   DESCRIPTION OF PROCEDURE:  Under adequate spinal anesthesia, the patient was  placed in a supine with a left lateral tilt.  She was sterilely prepped and  draped in the usual fashion.  An indwelling Foley catheter was sterilely  placed.  0.25% Marcaine was injected along the previous Pfannenstiel skin  incision.  A Pfannenstiel skin incision was then made, carried down to the  rectus fascia.  The rectus fascia was opened transversely.  The rectus  fascia was then bluntly and sharply dissected off the rectus muscle in a  Pfannenstiel fashion.  The rectus muscle was then split in the midline.  The  parietal peritoneum was entered sharply and extended.  The lower uterine  segment was well developed.  The bladder peritoneum was somewhat adherent to  the lower uterine segment.  Careful dissection transversely opened the  vesicouterine peritoneum and the bladder was then bluntly dissected off the  lower uterine segment.  The lower uterine segment was displaced inferiorly.  A curvilinear low transverse uterine incision was then made and extended  with bandage scissors.  Subsequent delivery of a live female infant was then  accomplished.  The baby was bulb suctioned on the abdomen.  The cord was  clamped and cut. The baby was transferred to the awaiting pediatrician who  assigned Apgars of 8 and 9 at 1 and 5 minutes.  The placenta was manually  removed from its anterior position.  The uterine cavity was cleaned of  debris.  The uterine incision had no extension.  The uterine incision was  closed in two layers, the first layer of 0 Monocryl running locked stitch.  The second layer was imbricated 0 Monocryl suture.  Bleeding on the right  was hemostased with two figure-of-eight 0 Monocryl sutures.  Small bleeders  inferiorly were cauterized. When good hemostasis was noted, the abdomen was  then copiously irrigated and suctioned of debris.  Normal tubes and ovaries  were noted bilaterally.  The parietal peritoneum was then closed with 2-0  Vicryl suture.  The subcutaneous area was inspected and small bleeders  cauterized.  The rectus fascia was closed with 0 Vicryl x2.  The  subcutaneous area was approximated using 2-0 plain sutures and the skin  approximated using Ethicon staples.  The specimen was placenta and not sent  to pathology.  Estimated blood loss  was 800 mL.  Interoperative fluid was 4 liters.  Urine output was about 250  mL clear yellow urine.  Sponge and instrument counts x2 was correct.  Complications were none.  Weight of the baby was 6 pounds 15 ounces.  The  patient tolerated the procedure well and was transferred to the recovery  room in stable condition.      Maxie Better, M.D.  Electronically Signed     [place]/MEDQ  D:  09/21/2005  T:  09/22/2005  Job:  [id number]

## 2010-06-23 NOTE — Discharge Summary (Signed)
NAME:  Shannon Mason, Shannon Mason            ACCOUNT NO.:  [id number]   MEDICAL RECORD NO.:  [id number]          PATIENT TYPE:  INP   LOCATION:  5708                         FACILITY:  [facility]   PHYSICIAN:  Cecille Aver, M.D.DATE OF BIRTH:  January 15, 1966   DATE OF ADMISSION:  01/24/2006  DATE OF DISCHARGE:  01/30/2006                               DISCHARGE SUMMARY   ADMISSION DIAGNOSES:  1. Severe abdominal pain, status post renal biopsy.  2. Proteinuria.  3. History of hypertension.   DISCHARGE DIAGNOSES:  1. Hemoperitoneum secondary to mesenteric vessel laceration, status      post renal biopsy.  2. Splenic capsular tear.  3. Proteinuria.  4. History of hypertension.   BRIEF HISTORY:  Shannon Mason is a 45 year old black female who has  history of worsening proteinuria brought in for renal biopsy by Dr.  Briant Cedar.  During second biopsy specimen was taken, she complained of  abdominal pain, which was severe in nature, given Morphine.  Flat plate  of the abdomen showed no perforation.  Systolic blood pressure did  decrease into the 90s and improved with IV fluids.  Hemoglobin 12.2,  previously was down to 11.4.  Concerned of possible retroperitoneal  bleeding or perforation per Dr. Briant Cedar.  Patient was admitted at 2300  for observation and evaluation.   PHYSICAL EXAMINATION ON ADMISSION:  VITAL SIGNS:  Blood pressure was  119/68.  Pulse was 74.  Temperature 90.5.  GENERAL APPEARANCE:  [age]-old black female in moderate  distress secondary to abdominal pain.  HEENT:  Sclerae are anicteric.  Extraocular movements intact.  LUNGS:  Clear to auscultation.  CARDIAC:  Regular rate and rhythm without murmur, rub or gallop.  ABDOMEN:  Bowel sounds are present, but decreased.  Diffusely tender  with rebound.  No hepatosplenomegaly.  EXTREMITIES:  No cyanosis, edema or clubbing.  NEUROLOGICAL EXAM:  Cranial nerves were intact.  Motor and sensory  intact.  No asterixis.   ADMITTING IMPRESSION:  Severe abdominal pain post renal biopsy.  Concern  for perforation with emergency surgical consultation obtained.  Followup  on labs closely.   COURSE IN HOSPITAL:  Patient post renal biopsy had CT scan to abdomen,  suggesting large fluid collection in pelvis and small intrinsic  hematoma.  No evidence of free air.  Surgery was consulted.  Patient  seen by Dr. Lindie Spruce and patient was taken to OR for exploratory  laparotomy.  Findings revealed a hemoperitoneum secondary to mesenteric  vessel laceration and splenic capsule tear.  Dr. Lindie Spruce repaired the  mesenteric laceration, controlled the splenic capsular tear with  surgery.  Patient's blood pressure was controlled with IV fluids.  IV  antibiotics and Zosyn was given.  Patient was transfused 2 units of  packed red blood cells.  Noted hemoglobin had dropped to 7.6, the lowest  and was up on the 25th of December rechecked 8.6 and day of discharge  was up to 9.4.  She had a urine culture showing no growth at the time of  this dictation.  Creatinine was stable at 0.1, BUN was 1, serum glucose  124, potassium 3.5, chloride was 105  and carbon dioxide was 25 on the  25th of December.   Patient's NG tubes were taken out on the 24th of December, was  tolerating full fluids on the 25th of December with her diet.  Had  positive flatulence.  Diet was advanced to bland soft, which she seemed  to tolerate and gained deemed stable from a surgical standpoint with  staples removed on the 26th of December, anticipating discharge.  Renal  biopsy results were not back at the time of this dictation.  We plan for  patient to be discharged later today on December 26.   Surgical followup is arranged by surgery in their office.  She is to see  Dr. Primitivo Gauze in his office in 1 week to 1-1/2 weeks for  followup on CBC, renal profile.   DISCHARGE MEDICATIONS:  1. Percocet 5/325 one to two tabs q.4-6 hours p.r.n. pain.  2. Micardis  80 mg take once a day if systolic blood pressure is over      160 or a diastolic blood pressure over 90.   DIET:  No added salt, low-sodium, low-fat.   ACTIVITY:  Return to work when cleared by surgeons.   WOUND CARE:  Of her surgical site as instructed by the surgery team.      Donald Pore, P.A.    ______________________________  Cecille Aver, M.D.    DWZ/MEDQ  D:  01/30/2006  T:  01/31/2006  Job:  [id number]   cc:   [facility]  Cherylynn Ridges, M.D.

## 2010-06-23 NOTE — Op Note (Signed)
Shannon Mason, SANDERLIN            ACCOUNT NO.:  [id number]   MEDICAL RECORD NO.:  [id number]          PATIENT TYPE:  INP   LOCATION:  2305                         FACILITY:  [facility]   PHYSICIAN:  Cherylynn Ridges, M.D.    DATE OF BIRTH:  22-Mar-1965   DATE OF PROCEDURE:  01/24/2006  DATE OF DISCHARGE:                               OPERATIVE REPORT   PREOPERATIVE DIAGNOSIS:  Peritonitis status post left renal biopsy.   POSTOPERATIVE DIAGNOSIS:  1. Massive hemoperitoneum secondary to mesenteric vessel laceration.  2. Splenic capsular tear iatrogenic.   PRINCIPAL PROCEDURE:  Exploratory laparotomy with the repair of  mesenteric laceration and control the splenic capsular tear.   SURGEON:  Cherylynn Ridges, M.D.   ASSISTANT:  Anselm Pancoast. Zachery Dakins, M.D.   ANESTHESIA:  General endotracheal.   ESTIMATED BLOOD LOSS:  1.7 liters.   COMPLICATIONS:  Splenic capsular tear.   CONDITION:  Serious.   INDICATIONS FOR OPERATION:  The patient developed peritonitis after a  left renal biopsy and comes to the OR now with CT scan showing diffuse  fluid in the belly, no free air, and peritonitis.   FINDINGS:  The patient had about 1.3 liters of blood in the peritoneal  cavity with continued oozing from the mesenteric bleed.  She developed a  capsular tear from the retractor during the case which was controlled  with thrombin Gelfoam, Avitene, Surgicel, and cautery.   OPERATION:  The patient was taken to the operating room and placed on  the table in a supine position.  After an adequate endotracheal  anesthetic was administered, she was prepped and draped in the usual  sterile manner exposing the midline of the abdomen.  The midline  incision was made from just below the xiphoid down to below the  umbilicus.  We took it down into the peritoneal cavity where immediately  we ran into a large amount of blood.  We aspirated blood from the  peritoneal cavity using the pool tip sucker and immediately  returned  about 800 mL of blood.  There was about another 300 to 350 mL of clot in  the left upper quadrant.  It was noted that there was a puncture of the  peritoneum lateral to the left colon.  We mobilized the left colon along  the line of Toldt and were able to see that it perforated through the  mesentery of the left colon and went into the mesentery of the proximal  jejunum.  There was, at that point, a blood vessel laceration of the  mesentery which was actively bleeding.  We suture ligated that x2 using  3-0 silk sutures full thickness through the mesentery to control the  bleeding.   During this process, we retracted in the left upper quadrant to help to  see the diaphragm and also possible injury to the spleen.  During  retraction, there was a tear of the inferior and superior pole of the  spleen which had to be cauterized and controlled with Surgicel, Avitene,  and thrombin Gelfoam.  About 100 mL of loss was from this splenic tear  alone.  After the bleeding was controlled, we irrigated with saline  solution throughout the peritoneal cavity and it appeared as though the  bleeding from the splenic capsule was controlled with the Avitene and  Gelfoam.  We irrigated one last time then closed the abdomen using a  running #1 PDS suture.  All needle counts, sponge counts, and instrument  counts were correct.   As we explored the abdomen, we thoroughly inspected the small bowel and  the colon from the sigmoid colon all the way up to the left colon which  was mobilized along the line of Toldt and found there to be no  perforation of the bowel itself.  We inspected the small bowel from the  ligament of Treitz all the way down to the terminal ileum and there was  no evidence of any visceral perforation.  The transverse colon, the  cecum, and the right colon were all free of any injury.  The only  obvious injury was the mesenteric laceration of the small bowel.   After we closed the  abdomen using running #1 PDS suture, we irrigated  with saline and closed the skin using stainless steel staples.  All  needle counts, sponge counts, and instrument counts were correct.      Cherylynn Ridges, M.D.  Electronically Signed     JOW/MEDQ  D:  01/24/2006  T:  01/25/2006  Job:  [id number]   cc:   Dyke Maes, M.D.  Maxie Better, M.D.

## 2010-06-23 NOTE — Discharge Summary (Signed)
NAME:  Shannon Mason, Shannon Mason            ACCOUNT NO.:  [id number]   MEDICAL RECORD NO.:  [id number]          PATIENT TYPE:  INP   LOCATION:  9102                          FACILITY:  [facility]   PHYSICIAN:  Maxie Better, M.D.DATE OF BIRTH:  09/16/1965   DATE OF ADMISSION:  09/21/2005  DATE OF DISCHARGE:  09/24/2005                                 DISCHARGE SUMMARY   ADMISSION DIAGNOSES:  1. Previous cesarean section x 2  2. Class A1 gestational diabetes.  3. Pregnancy-induced hypertension.  4. Term gestation   DISCHARGE DIAGNOSES:  1. Previous cesarean sections x2.  2. Pregnancy-induced hypertension.  3. Class A1 gestational diabetes.  4. Term gestation, delivered.   PROCEDURE:  Repeat cesarean section.   HOSPITAL COURSE:  The patient was admitted to [facility] as a 45-year-  old G3, P2 female at term with previous cesarean sections x2 and desiring a  repeat cesarean section. Her prenatal course had been complicated by class  A1 gestational diabetes  and in the latter part of pregnancy with pregnancy-  induced hypertension. The patient underwent a repeat cesarean section. The  procedure resulted in delivery of a live female weighing 6 pounds 15 ounces,  Apgars of 8 and 9. The patient had an uncomplicated postoperative course.  She did not require magnesium sulfate. PIH labs were normal. The patient had  CBC on postoperative day #1 that showed a hemoglobin of 10.4, hemoglobin  31.8, white count of 8.5. By postoperative day #3, the patient was  tolerating her diet and her incision had no erythema, induration or exudate.  She was deemed well to be discharged home.   DISPOSITION:  Home.   CONDITION:  Stable.   DISCHARGE MEDICATIONS:  1. Percocet 1 to 2 tablets every 4 to 6 hours p.r.n. pain.  2. Ibuprofen 800 mg every 8 hours p.r.n. pain.  3. Prenatal vitamins 1 p.o. daily.  4. Niferex Forte 1 p.o. daily.   FOLLOWUP APPOINTMENT:  At [facility] in 6 weeks and in 3 to 4  days for  blood pressure check and staple removal.   DISCHARGE INSTRUCTIONS:  Per the postpartum booklet given as well as PIH  warning signs.      Maxie Better, M.D.  Electronically Signed     [place]/MEDQ  D:  11/01/2005  T:  11/02/2005  Job:  [id number]

## 2010-06-23 NOTE — H&P (Signed)
NAME:  Shannon Mason, Shannon Mason            ACCOUNT NO.:  [id number]   MEDICAL RECORD NO.:  [id number]          PATIENT TYPE:  MAT   LOCATION:  MATC                          [facility]ACILITY[facility]  [facility]   PHYSICIAN:  Richardean Sale, M.D.   DATE OF BIRTH:  Feb 03, 1966   DATE OF ADMISSION:  09/21/2005  DATE OF DISCHARGE:                                HISTORY & PHYSICAL   ADMITTING DIAGNOSIS:  A 39-week intrauterine pregnancy with gestational  diabetes class A1, now with elevated blood pressures, probable pre-  eclampsia.   HISTORY OF PRESENT ILLNESS:  This is a 45 year old, gravida 3, para 2,  African American female with gestational diabetes and a history of cesarean  section x2 who has planned to have a C-section on September 24, 2005.  She  presented today for monitoring, given her diabetes.  She had an NST which  was reactive and a biophysical profile which was 6 out of 8.  While she were  here in maternity admissions, her blood pressures were noted to be elevated  with pressures ranging in the 130s to 140s over 90s to diastolic of 100.  The patient reports good fetal movement.  She does complain of occasional  contractions.  She denies any headache, had questionable blurred vision  earlier today but that has resolved.  She denies any epigastric pain, no  vaginal bleeding, or leaking of fluid.   Obstetrical care has been at [facility] with Dr. Maxie Better as  the primary attending.   PAST MEDICAL HISTORY:  1. Migraine.  2. Asthma.  3. Beta-thalassemia trait.  4. Sickle cell trait.  5. History of cryo surgery to the cervix.  6. History of HSV.  7. Cesarean section x2.   SOCIAL HISTORY:  She denies tobacco, alcohol, or drugs.   FAMILY HISTORY:  Daughter has sickle cell disease.  Mother had breast  cancer.   For additional history details, please see [place] forms.   PHYSICAL EXAMINATION:  VITAL SIGNS:  She is afebrile, blood pressure is  138/95 to 140/105, pulse 81, respirations  22, temp 97.4.  Fetal heart rate  tracing is reactive with a baseline in the 130s, no decelerations noted.  Contractions are irregular.  Biophysical profile is 6 out of 8.  GENERAL:  She is a well-developed, well-nourished, African American female  who is in no acute distress.  HEART:  Regular rate and rhythm.  LUNGS:  Clear to auscultation bilaterally.  ABDOMEN:  Gravid, soft, and nontender.  EXTREMITIES:  Show trace edema in the feet bilaterally, nontender.  NEUROLOGIC:  Nonfocal.  Deep tendon reflexes are 2+ bilaterally with no  clonus.   PRENATAL LABORATORY:  Blood type A positive.  Antibody screen negative.  Rubella immune.  Hepatitis B surface antigen nonreactive.  Group Beta strep  is negative.  HIV nonreactive.  RPR nonreactive.   LABORATORY STUDIES:  Here, white count 8.1, hemoglobin 11.3, hematocrit  35.1, platelets 179.  LDH is 128.  AST is 23, ALT is 16.  Creatinine is 1.2.  Uric acid is 8.9.  Urine is negative for protein.   ASSESSMENT:  A 45 year old gravida 3,  para 2, African American female with  gestational diabetes and now with elevated blood pressures and a creatinine  of 1.2.   PLAN:  1. Given the patient's 39 weeks' gestation, recommend cesarean delivery,      as the patient has also had two prior C-sections.  The OR has been      contacted.  C-section will be today at 5:30 p.m.  We will administer      antibiotics for prophylaxis at time of C-section.  2. Recheck pre-eclampsia labs after delivery.  3. Serial blood pressures.  4. Continuous fetal monitoring.      Richardean Sale, M.D.  Electronically Signed     JW/MEDQ  D:  09/21/2005  T:  09/21/2005  Job:  [id number]

## 2010-06-23 NOTE — H&P (Signed)
NAME:  Shannon Mason, Shannon Mason            ACCOUNT NO.:  [id number]   MEDICAL RECORD NO.:  [id number]          PATIENT TYPE:  INP   LOCATION:  5016                         FACILITY:  [facility]   PHYSICIAN:  Adolph Pollack, M.D.DATE OF BIRTH:  [birth year]   DATE OF ADMISSION:  02/08/2006  DATE OF DISCHARGE:                              HISTORY & PHYSICAL   REASON FOR ADMISSION:  Intra-abdominal abscess.   HISTORY OF PRESENT ILLNESS:  This is a 45 year old female who had some  proteinuria.  She subsequently underwent a percutaneous renal biopsy on  December20.  This was complicated by mesenteric vessel laceration  requiring emergency exploratory laparotomy for hemoperitoneum and repair  laceration.  Also, there was a splenic capsular tear done during the  surgery which was controlled.  She went home six days after her surgery.  She came in on December 28, which is postop day eight, complaining of  some pain and shortness of breath and was noted to have a pleural  effusion and a peri-splenic fluid collection with a little bit of air  that could have been just postop change.  She was placed on antibiotics  and actually went home a couple of days later on oral antibiotics,  improved.  However, she started having increasing left flank and left  lower quadrant pain with nausea and fever as well as shortness of  breath, specifically when she tries to lie flat.  She subsequently was  restarted on antibiotics by her primary care physician and she saw Dr.  Carolynne Edouard in our urgent office.  He sent her to [facility] for a  chest x-ray and a CBC.  The chest x-ray demonstrate left pleural  effusion, question pneumonia.  The CBC demonstrated a white blood cell  count of 10,900.  A chest CT was then done.  There is a left pleural  effusion but it does not appear to be pneumonia.  There is a left  subphrenic fluid collection with air in it suspicious for left  subphrenic abscess.  I subsequently was  called  to evaluate her.   PAST MEDICAL HISTORY:  1. Mild asthma.  2. Proteinuria.  3. Gestational diabetes.   PREVIOUS ABDOMINAL OPERATIONS:  Cesarean section x3; exploratory  laparotomy as above.   ALLERGIES:  EGGS, POULTRY, LATEX.   MEDICATIONS:  Micardis 80 mg p.o. daily p.r.n. systolic blood pressure  greater than 160, diastolic blood pressure greater than 90.  She is  currently also taking ciprofloxacin, Oxycodone p.r.n. pain, as well as a  stool softener.   SOCIAL HISTORY:  Notable for being married.  Nonsmoker, nondrinker.   REVIEW OF SYSTEMS:  She has had some constipation.  She has not been  able to keep much solid food down.  She has not vomited, however.   PHYSICAL EXAMINATION:  GENERAL:  Fatigue appearing female in no acute  distress, pleasant, cooperative.  VITAL SIGNS:  Temperature is 97.4, blood pressure is 125/81, pulse 98,  respiratory rate 18 and nonlabored.  O2 saturations 100% on room air.  HEENT:  Extraocular motions intact icterus.  NECK:  Supple without obvious masses.  RESPIRATORY:  There is significantly decreased breath sounds in the left  base noted.  CARDIOVASCULAR:  Regular rate and regular rhythm.  ABDOMEN:  Soft with a midline scar.  It is clean, dry and intact.  There  is some mild left upper quadrant and flank tenderness to palpation.  Intermittent bowel sounds are heard.  MUSCULOSKELETAL:  No clubbing, cyanosis or edema.   LABORATORY DATA:  Notable for white blood cell count of 10,900.  Potassium 3.8.  Hemoglobin 9.9.   CT scan was reviewed and discussed with Dr. Maryclare Bean.  It was a chest CT  with upper abdominal cuts.  This demonstrates a perisplenic fluid  collection with some air in it suspicious for left subphrenic abscess.  Left pleural effusion is also noted.   IMPRESSION:  1. Postoperative left subphrenic abscess by CT scan and clinically.  2. Left pleural effusion - likely the reason for some of her dyspnea      when supine.   Hopefully, this does not become secondarily infected      and become an empyema.   PLAN:  Admit to the hospital.  Start IV antibiotics.  I have discussed  with her getting interventional radiology to perform percutaneous  drainage of the left subphrenic abscess and she is agreeable to this.  I  have also discussed with interventional radiology.  We will then follow  up on cultures.  If, indeed, this is an infected fluid collection and is  drained and she continues to have a problem, she may need to have a  thoracentesis done of her pleural effusion to make sure that is not  secondarily infected.      Adolph Pollack, M.D.  Electronically Signed     TJR/MEDQ  D:  02/09/2006  T:  02/09/2006  Job:  [id number]   cc:   Jocelyn Lamer D. Pecola Leisure, M.D.  Dyke Maes, M.D.

## 2010-06-23 NOTE — Discharge Summary (Signed)
NAME:  Shannon Mason, Shannon Mason NO.:  [id number]   MEDICAL RECORD NO.:  [id number]          PATIENT TYPE:  INP   LOCATION:  5016                         FACILITY:  [facility]   PHYSICIAN:  Cherylynn Ridges, M.D.    DATE OF BIRTH:  [birth year]   DATE OF ADMISSION:  02/08/2006  DATE OF DISCHARGE:  02/12/2006                               DISCHARGE SUMMARY   ADMITTING PHYSICIAN:  Adolph Pollack, M.D.   DISCHARGE PHYSICIAN:  Cherylynn Ridges, M.D.   CHIEF COMPLAINT/REASON FOR ADMISSION:  Shannon Mason is a 45 year old  female patient known to our practice.  She had been admitted on January 24, 2006, because of needing a percutaneous renal biopsy to evaluate  proteinuria.  This procedure was complicated by mesenteric vessel  laceration requiring emergency exploratory laparotomy for hemoperitoneum  and repair of a splenic laceration i.e., capsular tear done during the  surgery.  The patient was discharged within a week after surgery.  She  had been treated for pneumonia with antibiotics during the  hospitalization and continued on four days' worth of Avelox after  discharge for a right-sided opacity.  She presented back to the office  on February 01, 2006, complaining of pain and shortness of breath and  was found to have a pleural effusion and a perisplenic fluid collection.  She was placed on antibiotics and improved.  Some time thereafter, she  redeveloped symptoms and now is having increasing left flank, left lower  quadrant pain with nausea and fever, as well as shortness of breath, and  orthopnea.  Her primary care physician, Dr. Pecola Leisure, placed the patient on  antibiotics and she was evaluated by Dr. Carolynne Edouard in the urgent office at  [facility].  She was sent to [facility] for x-  rays.  A chest x-ray did demonstrate a left pleural effusion and  pneumonia.  The patient's white cell count was slightly elevated at  10,900.  A CT of the chest was done.  This  demonstrated a left pleural  effusion and a left subphrenic fluid collection with air year that was  suspicious for a left subphrenic abscess.  Subsequently, the patient was  admitted on February 08, 2006, by Dr. Abbey Chatters for suspected left  subphrenic abscess.   On initial exam, her labs are as noted, hemoglobin 9.9.  She was  experiencing mild left upper quadrant and flank tenderness to palpation  but otherwise abdomen was nonacute.   ADMITTING DIAGNOSES:  1. Suspected intra-abdominal abscess, left subphrenic region.  2. Left pleural effusion, question associated pneumonia.   HOSPITAL COURSE:  The patient was admitted to the general medical floor,  where she was started on IV Zosyn empirically as well as IV medications  for pain control.   Because there was some concern about a subphrenic abscess,  interventional radiology was asked to evaluate the patient for possible  percutaneous drainage.  At this point, Dr. Johna Sheriff had assumed care of  the patient.  He reviewed the CT scans with radiologist Dr. Providence Lanius, and  in comparing with prior CT scans and x-rays done recently, it  was felt  that the fluid collection was decreasing and would be difficult at this  point to try to access; therefore, percutaneous drainage was delayed.  By February 10, 2006, the patient was still dyspneic but sating 98% on  room air.  She was noted to have diminished lung sounds in the left  base.   Repeat chest x-ray on February 11, 2006, did reveal continued left pleural  effusion, although slightly decreased in size.  The patient at this  point was tolerating room air but still complaining of orthopnea.  Her  pleuritic type chest discomfort had improved with use of pain  medications.  Dr. Lindie Spruce subsequently reviewed all x-rays and CTs and  felt that the patient did not truly have a subphrenic abscess but that  she had a reactive fluid collection secondary to the left pleural  effusion and recent surgery and  hemoperitoneum as noted.   By February 12, 2006, the patient remained afebrile, vital signs were  stable.  She was sating 98% on room air at rest.  She was still  complaining of orthopnea but no dyspnea on exertion.  Plans were at this  point to obtain room air sat supine and with ambulation and the patient  will have an order for home O2  if she meets criteria.  Otherwise, the  patient is deemed appropriate for discharge home.  She continues to have  decreased lung sounds in the left base.   In addition, the patient was found to be anemic.  Her last hemoglobin  checked on February 10, 2006, was 8.8.  The patient states she has had  problem with this in the past.  This is a microcytic anemia.  She has  over-the-counter iron pills which she takes intermittently at home as  directed by her primary care physician.  She has been instructed to  resume these pills.  The patient notes that when she takes too high a  dose of iron, she gets headaches.  I instructed her to follow the  directions on the bottle and have Dr. Pecola Leisure care give her further  direction at followup.   FINAL DISCHARGE DIAGNOSES:  1. Left subphrenic fluid collection, resolving.  2. Left pleural effusion with probable associated pneumonia.  3. Dyspnea and orthopnea, stable.  4. Iron-deficiency anemia, history of chronic in the past.   DISCHARGE MEDICATIONS:  1. Resume all home medications she was taking prior to admission.  2. Over-the-counter iron pills as directed.  3. Percocet 5/325, one to two tabs every 4 hours as needed for pain,      prescription given.  4. Over-the-counter ibuprofen in between the Percocet if you are not      allergic to this medication.  5. Augmentin 500 mg twice daily for 7 days for lung infection,      prescription given.   DIET:  No restrictions.   ACTIVITY:  Increase activity slowly.  May shower.  May walk up steps.   WOUND CARE:  Not applicable.  RETURN TO WORK:  This will be discussed  at visit with Dr. Abbey Chatters  next week.   FOLLOW-UP APPOINTMENTS:  1. You are to keep your scheduled appointment with Dr. Briant Cedar next      week  2. You are to keep your scheduled appointment with Dr. Abbey Chatters next      week.  3. You need to call Dr. Pecola Leisure to be seen and 1-2 weeks, will need      followup chest x-ray  and eventual followup of anemia issues.  4. Home Health if needed for O2 after discharge.  Clinical evaluation      data pending.  5. The patient tells me she was started on an antibiotic and has 30      days prescription at home from her primary      care physician, but she could not recall the name of this      medication.  I told her to clarify with Dr. Pecola Leisure if this      medication for which she already has could be used for treatment of      pneumonia, otherwise the patient has been instructed to take the      Augmentin as prescribed.      Allison L. Rennis Harding, N.P.      Cherylynn Ridges, M.D.  Electronically Signed    [facility]/MEDQ  D:  02/12/2006  T:  02/12/2006  Job:  [id number]   cc:   Dyke Maes, M.D.  Betti D. Pecola Leisure, M.D.

## 2010-06-23 NOTE — Discharge Summary (Signed)
NAME:  Shannon Mason, Shannon Mason            ACCOUNT NO.:  [id number]   MEDICAL RECORD NO.:  [id number]          PATIENT TYPE:  INP   LOCATION:  4704                         FACILITY:  [facility]   PHYSICIAN:  Betti D. Pecola Leisure, M.D.   DATE OF BIRTH:  1966-01-12   DATE OF ADMISSION:  02/01/2006  DATE OF DISCHARGE:  02/03/2006                               DISCHARGE SUMMARY   ADMISSION DIAGNOSIS:  Left flank pain.   DISCHARGE DIAGNOSIS:  Abdominal pain due to parasplenic fluid  collection.   HISTORY OF PRESENT ILLNESS:  The patient is a 45 year old African  American female who is about 6 to 9 weeks out from a normal vaginal  delivery.  The patient was found to have proteinuria after her delivery,  for which she was seeing a nephrologist.  This patient explained to me  that she had seen a nephrologist who investigated her proteinuria  further by doing a renal biopsy and during that procedure there was  presumed to be a colonic perforation, for which she had to have  exploratory surgery.  The patient states that she had a hospital stay  from December 20 to December 26 to recover from this particular surgery.  The patient came to my office complaining of left-sided flank pain  several days ago, but not to the degree that she presented within the  past 24 hours.  Her temperature at the time was 100 to 100.1 per the  patient.  She had pain with inspiration, but had no cough or abnormal  vitals.  On examination in the ER, the patient had a temperature of  99.7, pulse was 114, normal respirations, blood pressure 142/94 with  pulse ox of 96% on room air.  She appears sickly and uncomfortable.  She  had decreased breath sounds in both lung bases.  The rest of her exam at  the time was normal with the exception of abdominal discomfort.   A CT of the chest was done, which showed an enlarged left effusion  compared to a previous CT of her chest, which was consistent.  The  patient also had a CT of her abdomen  and pelvis on her hospitalization,  which showed a parasplenic fluid collection that persists from previous  film.  There was no evidence of any new process on that film.  The  patient was started on Rocephin 1 gram IV daily along with Zithromax 250  mg IV daily.  Her pain was managed with Tylenol extra strength also she  was started on Protonix 40 mg daily.  The patient's pain was basically  manageable with the Tylenol extra strength and OxyIR was added 1 to 2  tablets q.4 hours to manage her pain as well.  Since there was no new  process going on with her abdomen, except for the parasplenic effusion,  that was persistent from her surgery back on her previous admission  between December 20 and December 26.  The patient was discharged home  with the assurance that part of her process was just still the internal  healing from her surgery.  She was advised to return  to her surgeon for  further followup.  She was discharged home on December 30, to see Dr.  Pecola Leisure in 5 days.  Percocet was given to her, 10/325 mg one q.4-6 hours  as needed.  She was continued on Zithromax for an additional 4 days to  50 mg once a day.           ______________________________  Isidoro Donning. Pecola Leisure, M.D.     BDR/MEDQ  D:  06/07/2006  T:  06/08/2006  Job:  [id number]

## 2010-06-24 NOTE — Discharge Summary (Signed)
  NAME:  Shannon Mason, Shannon Mason            ACCOUNT NO.:  [id number]  MEDICAL RECORD NO.:  [id number]           PATIENT TYPE:  I  LOCATION:  9304                          [facility]  PHYSICIAN:  Maxie Better, M.D.DATE OF BIRTH:  06-27-1965  DATE OF ADMISSION:  05/12/2010 DATE OF DISCHARGE:  05/14/2010                              DISCHARGE SUMMARY   ADMISSION DIAGNOSES:  Dysmenorrhea, menorrhagia, chronic hypertension, beta thalassemia trait.  DISCHARGE DIAGNOSES:  Dysmenorrhea, menorrhagia, bowel adhesions, chronic hypertension, beta thalassemia trait.  PROCEDURES:  Exploratory laparotomy, total abdominal hysterectomy, lysis of adhesions, repair of bowel serosal tear.  HISTORY OF PRESENT ILLNESS:  This is a 45 year old married black female who is gravida 4, para 4, history of tubal ligation, previous cesarean section x3, ventral hernia repair with mesh who was admitted for TAH due to menorrhagia, severe dysmenorrhea.  HOSPITAL COURSE:  The patient was admitted to [facility].  She was taken to the operating room where she underwent the above procedure. Incidentally findings of bowel adhesions to the anterior abdominal wall resulted in intraoperative consultation with Dr. Gerrit Friends who was able to take down the bowels off of the anterior wall to facilitate the surgery. Please see the dictated operative report.  During the process some serosal tear of the bowel occurred which was repaired by him.  The patient's postoperative course was unremarkable.  Her CBC on postop day #1 showed a hemoglobin of 11.5, hematocrit 37.4, white count of 15.8, platelet count of 261,000.  On postop day #2 had flatus.  Temperature max of 99.1.  Her incision had no erythema, induration or exudate.  She subsequently had more active bowel sounds later that afternoon and was deemed well to be discharged home.  Disposition is home.  Condition stable.  DISCHARGE MEDICATIONS:  Augmentin 875 mg 1 p.o.  b.i.d. for 4 days. Resume home reconciliation medication list.  Tylox 1-2 tablets every 3-4 hours p.r.n. pain.  Follow up on April 13 at the office for staple removal.  DISCHARGE INSTRUCTIONS:  Call for temperature greater than or equal to 100.4, nothing per vagina for 4-6 weeks.  No heavy lifting or driving for 2 weeks.  Call for severe abdominal pain, nausea, vomiting, increased incisional pain, drainage or redness from the incision site.     Maxie Better, M.D.     Shannon/MEDQ  D:  06/19/2010  T:  06/19/2010  Job:  [id number]  Electronically Signed by Nena Jordan Hiba Garry M.D. on 06/24/2010 06:45:24 PM

## 2011-03-29 ENCOUNTER — Other Ambulatory Visit: Payer: Self-pay | Admitting: Family Medicine

## 2011-03-29 ENCOUNTER — Ambulatory Visit
Admission: RE | Admit: 2011-03-29 | Discharge: 2011-03-29 | Disposition: A | Discharge status: Home / Self Care | Payer: PRIVATE HEALTH INSURANCE | Source: Ambulatory Visit | Attending: Family Medicine | Admitting: Family Medicine

## 2011-03-29 DIAGNOSIS — M25559 Pain in unspecified hip: Secondary | ICD-10-CM

## 2011-04-04 ENCOUNTER — Ambulatory Visit: Payer: PRIVATE HEALTH INSURANCE | Attending: Family Medicine

## 2011-04-04 DIAGNOSIS — M25559 Pain in unspecified hip: Secondary | ICD-10-CM | POA: Insufficient documentation

## 2011-04-04 DIAGNOSIS — R5381 Other malaise: Secondary | ICD-10-CM | POA: Insufficient documentation

## 2011-04-04 DIAGNOSIS — IMO0001 Reserved for inherently not codable concepts without codable children: Secondary | ICD-10-CM | POA: Insufficient documentation

## 2011-04-04 DIAGNOSIS — M25569 Pain in unspecified knee: Secondary | ICD-10-CM | POA: Insufficient documentation

## 2011-04-12 ENCOUNTER — Ambulatory Visit: Payer: PRIVATE HEALTH INSURANCE

## 2012-05-22 ENCOUNTER — Telehealth: Payer: Self-pay | Admitting: Genetic Counselor

## 2012-05-22 NOTE — Telephone Encounter (Signed)
S/W PT IN REF TO NP APPT. 07/24/12@11 :00 REFERRING DR COUSINS GENETIC COUNS. MAILED NP PACKET

## 2012-05-30 ENCOUNTER — Telehealth: Payer: Self-pay | Admitting: Genetic Counselor

## 2012-05-30 NOTE — Telephone Encounter (Signed)
LVOM FOR PT TO RETURN CALL IN RE TO GENETIC APPT.  °

## 2012-06-17 ENCOUNTER — Emergency Department (HOSPITAL_COMMUNITY)

## 2012-06-17 ENCOUNTER — Encounter (HOSPITAL_COMMUNITY): Payer: Self-pay | Admitting: *Deleted

## 2012-06-17 ENCOUNTER — Emergency Department (HOSPITAL_COMMUNITY)
Admission: EM | Admit: 2012-06-17 | Discharge: 2012-06-17 | Disposition: A | Discharge status: Home / Self Care | Attending: Emergency Medicine | Admitting: Emergency Medicine

## 2012-06-17 DIAGNOSIS — Y929 Unspecified place or not applicable: Secondary | ICD-10-CM | POA: Insufficient documentation

## 2012-06-17 DIAGNOSIS — R109 Unspecified abdominal pain: Secondary | ICD-10-CM | POA: Insufficient documentation

## 2012-06-17 DIAGNOSIS — I10 Essential (primary) hypertension: Secondary | ICD-10-CM | POA: Insufficient documentation

## 2012-06-17 DIAGNOSIS — N289 Disorder of kidney and ureter, unspecified: Secondary | ICD-10-CM | POA: Insufficient documentation

## 2012-06-17 DIAGNOSIS — Z79899 Other long term (current) drug therapy: Secondary | ICD-10-CM | POA: Insufficient documentation

## 2012-06-17 DIAGNOSIS — X503XXA Overexertion from repetitive movements, initial encounter: Secondary | ICD-10-CM | POA: Insufficient documentation

## 2012-06-17 DIAGNOSIS — Z8719 Personal history of other diseases of the digestive system: Secondary | ICD-10-CM | POA: Insufficient documentation

## 2012-06-17 DIAGNOSIS — Y990 Civilian activity done for income or pay: Secondary | ICD-10-CM | POA: Insufficient documentation

## 2012-06-17 HISTORY — DX: Essential (primary) hypertension: I10

## 2012-06-17 LAB — COMPREHENSIVE METABOLIC PANEL
ALT: 12 U/L (ref 0–35)
AST: 14 U/L (ref 0–37)
Albumin: 3.6 g/dL (ref 3.5–5.2)
Alkaline Phosphatase: 76 U/L (ref 39–117)
BUN: 15 mg/dL (ref 6–23)
CO2: 32 mEq/L (ref 19–32)
Calcium: 10.1 mg/dL (ref 8.4–10.5)
Chloride: 98 mEq/L (ref 96–112)
Creatinine, Ser: 0.83 mg/dL (ref 0.50–1.10)
GFR calc Af Amer: >90 mL/min (ref >90)
GFR calc non Af Amer: 83 mL/min — ABNORMAL LOW (ref >90)
Glucose, Bld: 94 mg/dL (ref 70–99)
Potassium: 4 mEq/L (ref 3.5–5.1)
Sodium: 137 mEq/L (ref 135–145)
Total Bilirubin: 0.4 mg/dL (ref 0.3–1.2)
Total Protein: 6.9 g/dL (ref 6.0–8.3)

## 2012-06-17 LAB — CBC WITH DIFFERENTIAL/PLATELET
Basophils Absolute: 0 10*3/uL (ref 0.0–0.1)
Basophils Relative: 1 % (ref 0–1)
Eosinophils Absolute: 0.2 10*3/uL (ref 0.0–0.7)
Eosinophils Relative: 3 % (ref 0–5)
HCT: 39.8 % (ref 36.0–46.0)
Hemoglobin: 13 g/dL (ref 12.0–15.0)
Lymphocytes Relative: 40 % (ref 12–46)
Lymphs Abs: 3.1 10*3/uL (ref 0.7–4.0)
MCH: 23.5 pg — ABNORMAL LOW (ref 26.0–34.0)
MCHC: 32.7 g/dL (ref 30.0–36.0)
MCV: 72 fL — ABNORMAL LOW (ref 78.0–100.0)
Monocytes Absolute: 0.6 10*3/uL (ref 0.1–1.0)
Monocytes Relative: 8 % (ref 3–12)
Neutro Abs: 3.8 10*3/uL (ref 1.7–7.7)
Neutrophils Relative %: 49 % (ref 43–77)
Platelets: 297 10*3/uL (ref 150–400)
RBC: 5.53 MIL/uL — ABNORMAL HIGH (ref 3.87–5.11)
RDW: 16.3 % — ABNORMAL HIGH (ref 11.5–15.5)
WBC: 7.8 10*3/uL (ref 4.0–10.5)

## 2012-06-17 LAB — URINALYSIS, ROUTINE W REFLEX MICROSCOPIC
Bilirubin Urine: NEGATIVE
Glucose, UA: NEGATIVE mg/dL
Hgb urine dipstick: NEGATIVE
Ketones, ur: NEGATIVE mg/dL
Leukocytes, UA: NEGATIVE
Nitrite: NEGATIVE
Protein, ur: NEGATIVE mg/dL
Specific Gravity, Urine: 1.014 (ref 1.005–1.030)
Urobilinogen, UA: 0.2 mg/dL (ref 0.0–1.0)
pH: 5.5 (ref 5.0–8.0)

## 2012-06-17 LAB — LIPASE, BLOOD: Lipase: 20 U/L (ref 11–59)

## 2012-06-17 MED ORDER — ONDANSETRON HCL 4 MG/2ML IJ SOLN
4.0000 mg | Freq: Once | INTRAMUSCULAR | Status: AC
  Administered 2012-06-17: 4 mg via INTRAVENOUS
  Filled 2012-06-17: qty 2

## 2012-06-17 MED ORDER — SODIUM CHLORIDE 0.9 % IV BOLUS (SEPSIS)
1000.0000 mL | Freq: Once | INTRAVENOUS | Status: AC
  Administered 2012-06-17: 1000 mL via INTRAVENOUS

## 2012-06-17 MED ORDER — IBUPROFEN 800 MG PO TABS: 800.0000 mg | ORAL_TABLET | Freq: Three times a day (TID) | ORAL | Status: DC | PRN

## 2012-06-17 MED ORDER — IOHEXOL 300 MG/ML  SOLN
100.0000 mL | Freq: Once | INTRAMUSCULAR | Status: AC | PRN
  Administered 2012-06-17: 100 mL via INTRAVENOUS

## 2012-06-17 MED ORDER — MORPHINE SULFATE 4 MG/ML IJ SOLN
4.0000 mg | Freq: Once | INTRAMUSCULAR | Status: AC
  Administered 2012-06-17: 4 mg via INTRAVENOUS
  Filled 2012-06-17: qty 1

## 2012-06-17 MED ORDER — IOHEXOL 300 MG/ML  SOLN
50.0000 mL | Freq: Once | INTRAMUSCULAR | Status: AC | PRN
  Administered 2012-06-17: 50 mL via ORAL

## 2012-06-17 NOTE — ED Notes (Signed)
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  Relayed information to [place], Charity fundraiser.

## 2012-06-17 NOTE — ED Provider Notes (Signed)
Medical screening examination/treatment/procedure(s) were performed by non-physician practitioner and as supervising physician I was immediately available for consultation/collaboration.    Celene Kras, MD 06/17/12 2128

## 2012-06-17 NOTE — ED Provider Notes (Signed)
History     CSN: [id number]  Arrival date & time 06/17/12  1718   First MD Initiated Contact with Patient 06/17/12 1757      Chief Complaint  Patient presents with  . Abdominal Pain    (Consider location/radiation/quality/duration/timing/severity/associated sxs/prior treatment) HPI Patient presents to the emergency department with mid abdominal pain, that began while she was at work earlier today.  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  Patient denies vomiting, diarrhea, chest pain, shortness of breath, back pain, headache, blurred vision, fever, dysuria, dizziness, or syncope.  Patient, states, that is a sharp pain in her right side of her abdomen.  Patient, states she did not take anything prior to arrival for her symptoms.  She states she saw her primary care doctor, who sent her here.  Due to the fact that she does not take [employer]. Past Medical History  Diagnosis Date  . Renal disorder     kidney disease  . Hypertension     Past Surgical History  Procedure Laterality Date  . Hernia repair      2010  . Tubal ligation      History reviewed. No pertinent family history.  History  Substance Use Topics  . Smoking status: Not on file  . Smokeless tobacco: Not on file  . Alcohol Use: No    OB History   Grav Para Term Preterm Abortions TAB SAB Ect Mult Living                  Review of Systems All other systems negative except as documented in the HPI. All pertinent positives and negatives as reviewed in the HPI. Allergies  Vicodin  Home Medications   Current Outpatient Rx  Name  Route  Sig  Dispense  Refill  . acetaminophen (TYLENOL) 500 MG tablet   Oral   Take 1,000 mg by mouth every 6 (six) hours as needed for pain (pain).         . carvedilol (COREG) 6.25 MG tablet   Oral   Take 6.25 mg by mouth 2 (two) times daily with a  meal.         . cetirizine (ZYRTEC) 10 MG tablet   Oral   Take 10 mg by mouth 2 (two) times daily.         . cholecalciferol (VITAMIN D) 1000 UNITS tablet   Oral   Take 1,000 Units by mouth daily.         . ferrous fumarate (HEMOCYTE - 106 MG FE) 325 (106 FE) MG TABS   Oral   Take 1 tablet by mouth daily.         Marland Kitchen glucosamine-chondroitin 500-400 MG tablet   Oral   Take 1 tablet by mouth 2 (two) times daily.         Marland Kitchen losartan-hydrochlorothiazide (HYZAAR) 100-25 MG per tablet   Oral   Take 0.5 tablets by mouth daily. 1/2 tablet by mouth once daily         . meclizine (ANTIVERT) 25 MG tablet   Oral   Take 25 mg by mouth 3 (three) times daily as needed (nausea).         Marland Kitchen omeprazole (PRILOSEC) 20 MG capsule   Oral   Take 20 mg by mouth 2 (two) times daily.         Marland Kitchen  promethazine (PHENERGAN) 25 MG tablet   Oral   Take 25 mg by mouth at bedtime as needed for nausea (nausea).         . valACYclovir (VALTREX) 500 MG tablet   Oral   Take 500 mg by mouth as needed (falre up).         . vitamin B-12 (CYANOCOBALAMIN) 100 MCG tablet   Oral   Take 100 mcg by mouth daily.           BP 106/72  Pulse 88  Temp(Src) 97.9 F (36.6 C) (Oral)  Resp 20  Ht 5\' 4"  (1.626 m)  Wt 170 lb (77.111 kg)  BMI 29.17 kg/m2  SpO2 97%  Physical Exam  Nursing note and vitals reviewed. Constitutional: She is oriented to person, place, and time. She appears well-developed and well-nourished.  HENT:  Head: Normocephalic and atraumatic.  Mouth/Throat: Oropharynx is clear and moist.  Cardiovascular: Normal rate, regular rhythm and normal heart sounds.  Exam reveals no gallop and no friction rub.   No murmur heard. Pulmonary/Chest: Effort normal and breath sounds normal.  Abdominal: Soft. Bowel sounds are normal. She exhibits no distension. There is no tenderness. There is no guarding.  Patient has a midline surgical scar, but no signs of hernia  Neurological: She is  alert and oriented to person, place, and time.  Skin: Skin is warm and dry. No rash noted.    ED Course  Procedures (including critical care time)  Labs Reviewed  CBC WITH DIFFERENTIAL - Abnormal; Notable for the following:    RBC 5.53 (*)    MCV 72.0 (*)    MCH 23.5 (*)    RDW 16.3 (*)    All other components within normal limits  COMPREHENSIVE METABOLIC PANEL - Abnormal; Notable for the following:    GFR calc non Af Amer 83 (*)    All other components within normal limits  LIPASE, BLOOD  URINALYSIS, ROUTINE W REFLEX MICROSCOPIC   Ct Abdomen Pelvis W Contrast  06/17/2012  *RADIOLOGY REPORT*  Clinical Data: Abdominal pain.  CT ABDOMEN AND PELVIS WITH CONTRAST  Technique:  Multidetector CT imaging of the abdomen and pelvis was performed following the standard protocol during bolus administration of intravenous contrast.  Contrast:  OMNIPAQUE IOHEXOL 300 MG/ML  SOLN  Comparison: 07/04/2008  Findings: Linear areas of atelectasis or scarring in the lung bases.  No effusions.  Heart is normal size.  Small lesion is seen peripherally in the right hepatic lobe with peripheral puddling of contrast.  This measures 1.6 cm and is most compatible with hemangioma.  This is unchanged in size since 2010. Gallbladder, spleen, pancreas, adrenals and kidneys are unremarkable.  Descending colonic and sigmoid diverticulosis.  No active diverticulitis.  Moderate stool burden throughout the colon.  Small bowel is decompressed.  No adnexal masses.  Urinary bladder is unremarkable.  No free fluid, free air or adenopathy.  Appendix is visualized and is normal.  No acute bony abnormality.  IMPRESSION: Descending colonic and sigmoid diverticulosis.  No acute findings in the abdomen or pelvis.   Original Report Authenticated By: Charlett Nose, M.D.    Patient most likely strained her abdominal wall, based on the fact she was lifting a heavy object above her head.  Patient is advised to return here as needed.  She is  told to followup with the doctor with whom her employer has a Community education officer for [employer]  MDM  MDM Reviewed: nursing note and vitals Interpretation: labs and  CT scan            Carlyle Dolly, PA-C 06/17/12 2127

## 2012-06-17 NOTE — ED Notes (Signed)
Pt reports while at work "I pulled down some trays and felt a stabbing pain" reports having a hernia repair in 2010. Also reports feeling faint with incident.

## 2012-06-17 NOTE — ED Notes (Signed)
Patient is alert and oriented x3.  She was given DC instructions and follow up visit instructions.  Patient gave verbal understanding. She was DC ambulatory under her own power to home.  V/S stable.  He was not showing any signs of distress on DC 

## 2012-07-24 ENCOUNTER — Encounter: Payer: PRIVATE HEALTH INSURANCE | Admitting: Genetic Counselor

## 2012-07-24 ENCOUNTER — Other Ambulatory Visit: Payer: PRIVATE HEALTH INSURANCE | Admitting: Lab

## 2012-07-25 ENCOUNTER — Ambulatory Visit (INDEPENDENT_AMBULATORY_CARE_PROVIDER_SITE_OTHER): Payer: No Typology Code available for payment source | Admitting: Surgery

## 2012-07-30 ENCOUNTER — Telehealth: Payer: Self-pay

## 2012-08-06 ENCOUNTER — Encounter (INDEPENDENT_AMBULATORY_CARE_PROVIDER_SITE_OTHER): Payer: Self-pay | Admitting: Surgery

## 2012-08-19 ENCOUNTER — Other Ambulatory Visit: Payer: Self-pay

## 2012-08-19 DIAGNOSIS — Z1231 Encounter for screening mammogram for malignant neoplasm of breast: Secondary | ICD-10-CM

## 2012-09-10 ENCOUNTER — Ambulatory Visit
Admission: RE | Admit: 2012-09-10 | Discharge: 2012-09-10 | Disposition: A | Discharge status: Home / Self Care | Payer: PRIVATE HEALTH INSURANCE | Source: Ambulatory Visit

## 2012-09-10 DIAGNOSIS — Z1231 Encounter for screening mammogram for malignant neoplasm of breast: Secondary | ICD-10-CM

## 2012-09-11 ENCOUNTER — Other Ambulatory Visit: Payer: Self-pay | Admitting: Obstetrics and Gynecology

## 2012-09-11 DIAGNOSIS — N63 Unspecified lump in unspecified breast: Secondary | ICD-10-CM

## 2012-10-09 ENCOUNTER — Ambulatory Visit
Admission: RE | Admit: 2012-10-09 | Discharge: 2012-10-09 | Disposition: A | Discharge status: Home / Self Care | Payer: PRIVATE HEALTH INSURANCE | Source: Ambulatory Visit | Attending: Obstetrics and Gynecology | Admitting: Obstetrics and Gynecology

## 2012-10-09 ENCOUNTER — Other Ambulatory Visit: Payer: Self-pay | Admitting: Obstetrics and Gynecology

## 2012-10-09 DIAGNOSIS — N63 Unspecified lump in unspecified breast: Secondary | ICD-10-CM

## 2012-10-16 ENCOUNTER — Other Ambulatory Visit: Payer: Self-pay

## 2013-01-21 NOTE — Telephone Encounter (Signed)
error 

## 2013-01-23 IMAGING — US US PELVIS COMPLETE
1 series · 13 of 25 positions shown · non-contrast
Comparison: CT 07/04/2008

CLINICAL DATA: Pelvic pain



[Series 1: us pelvis complete · 13 of 64 slices shown]
[im 1/64]
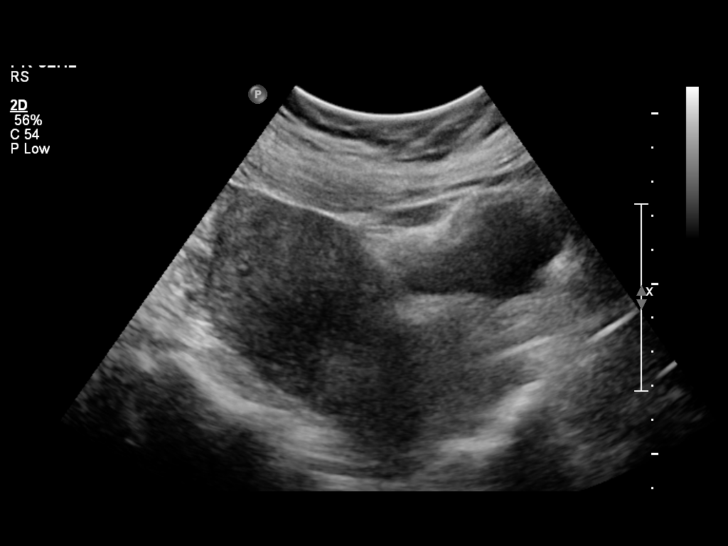
[im 6/64]
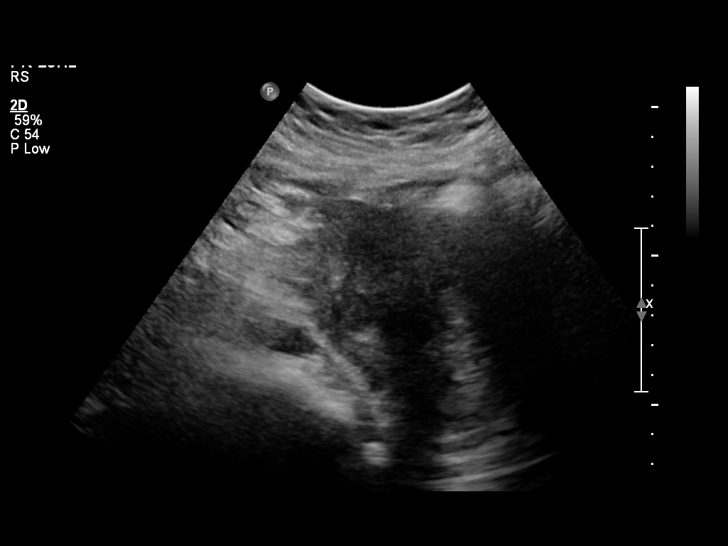
[im 11/64]
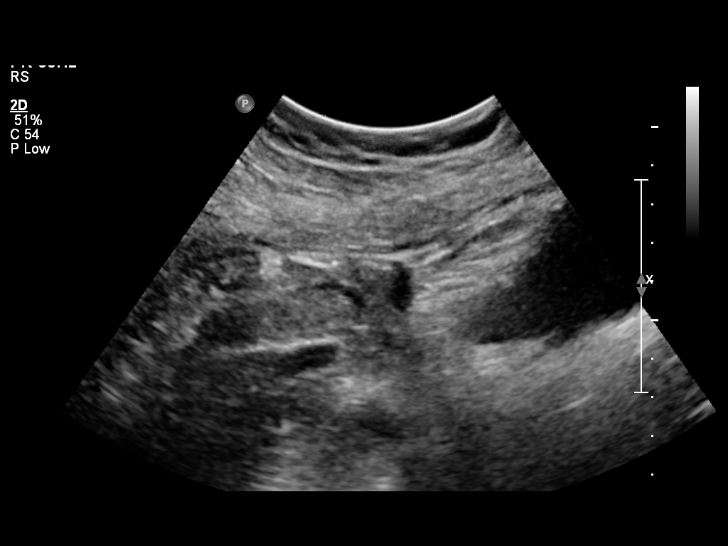
[im 16/64]
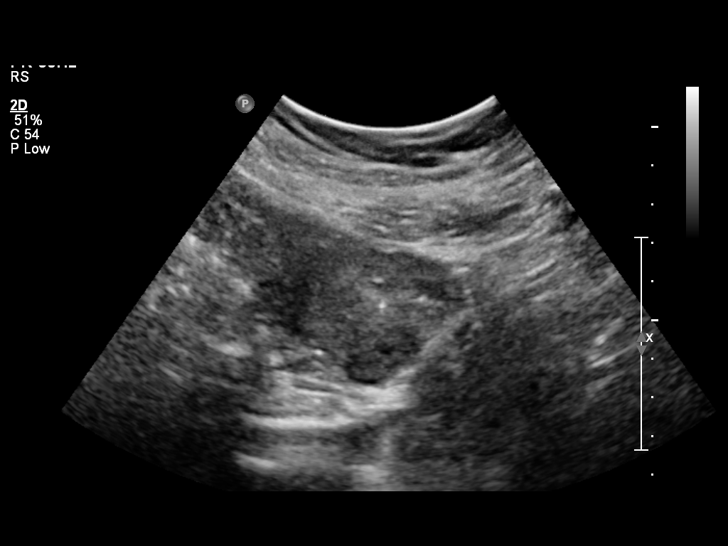
[im 22/64]
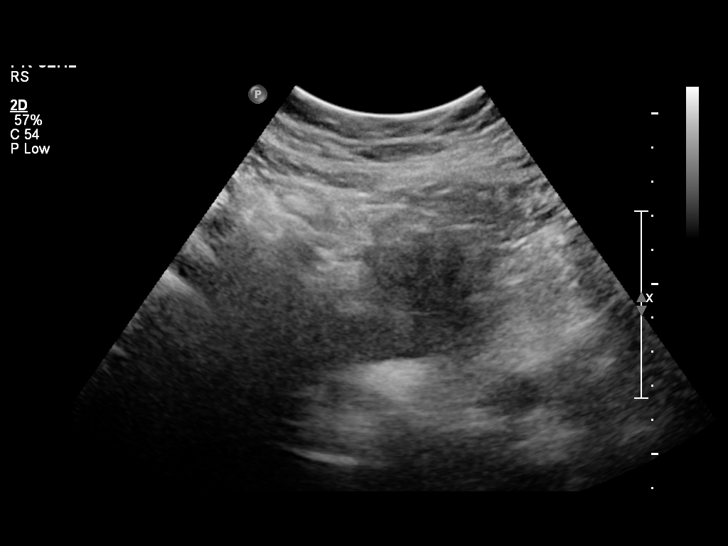
[im 27/64]
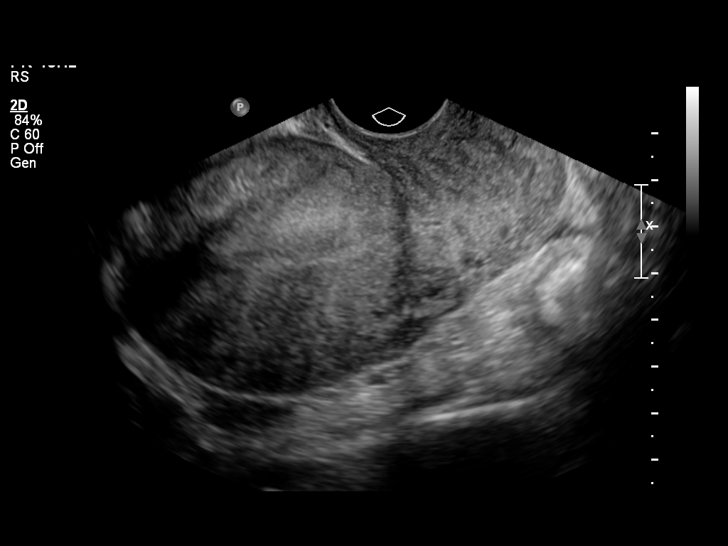
[im 32/64]
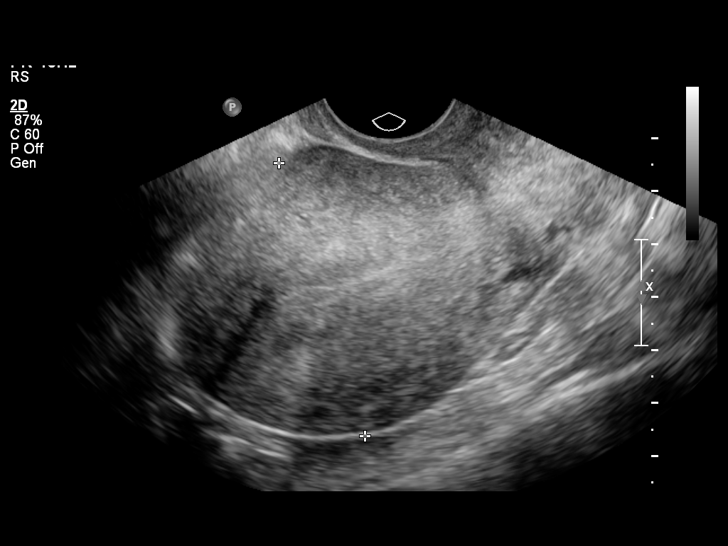
[im 37/64]
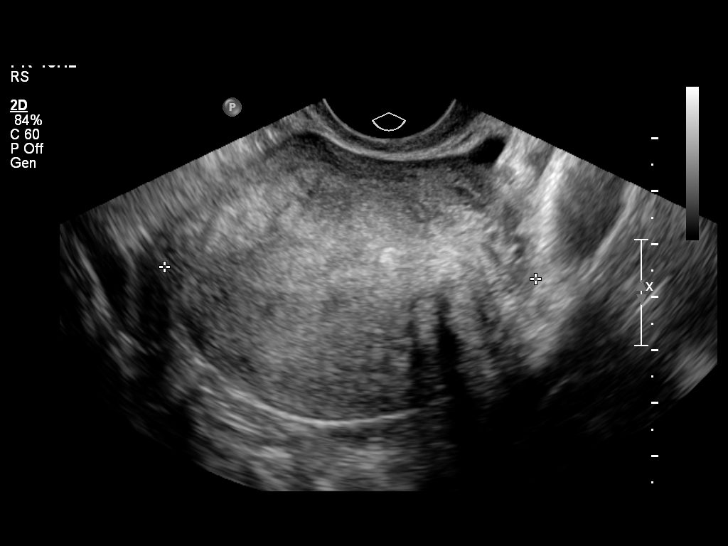
[im 43/64]
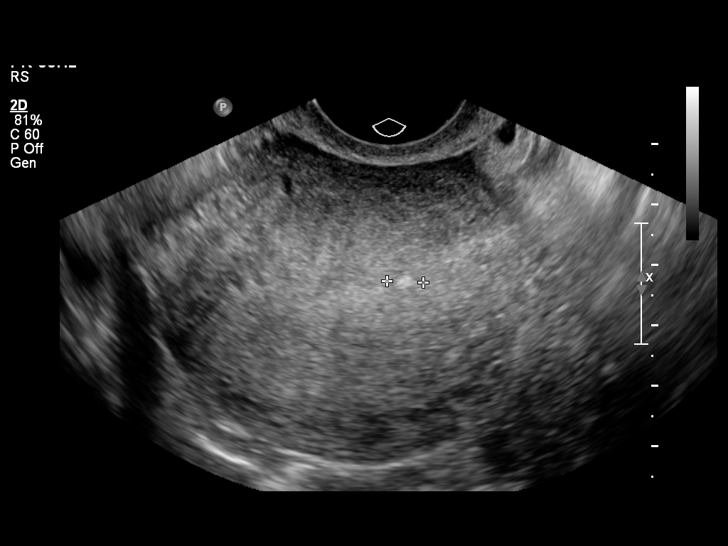
[im 48/64]
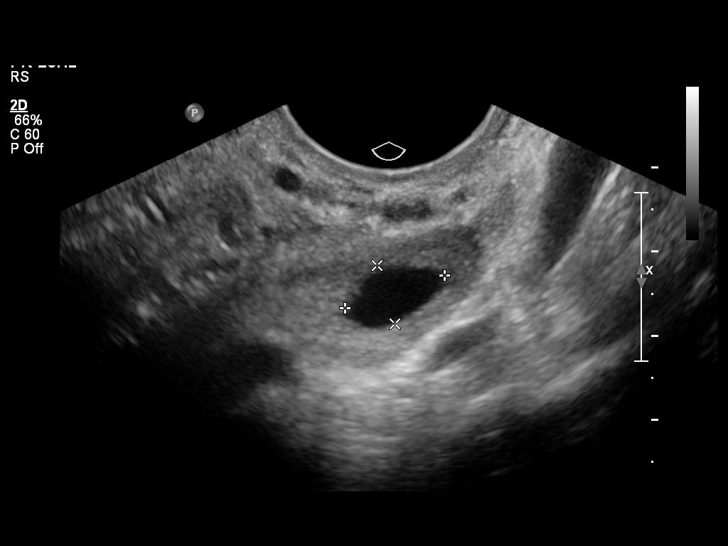
[im 53/64]
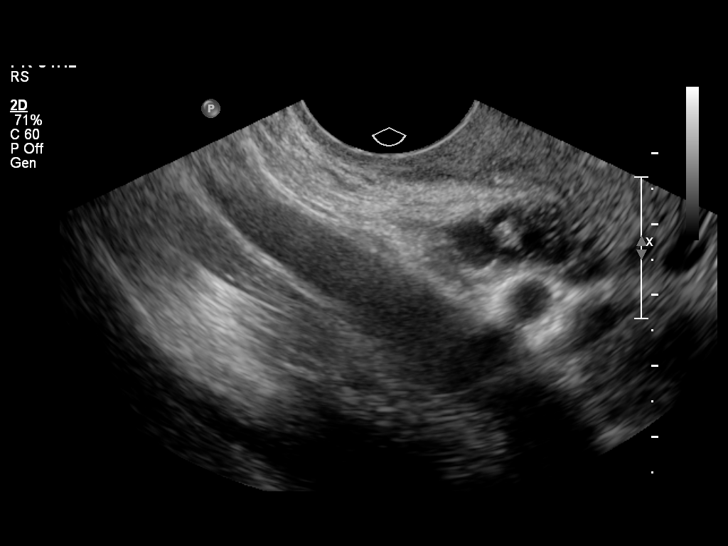
[im 58/64]
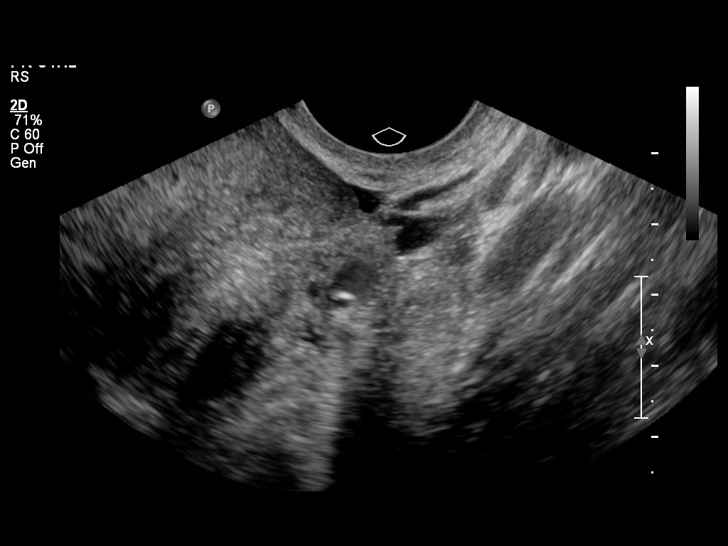
[im 64/64]
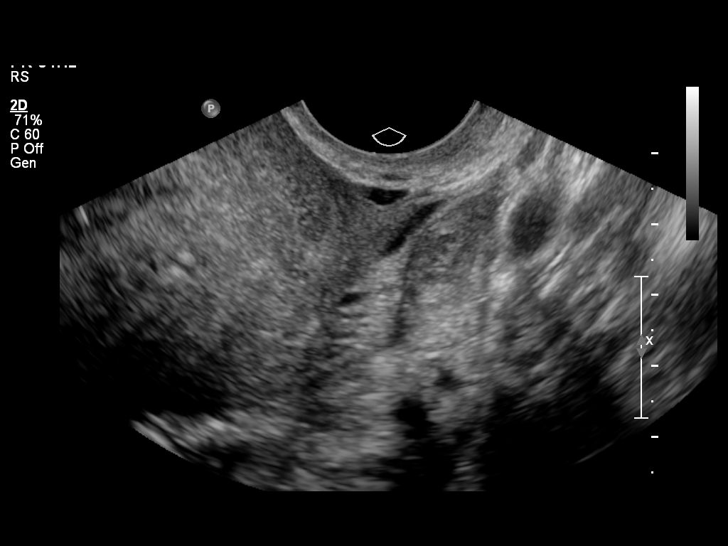

[13 of 25 positions shown; findings below may reference images not displayed]

FINDINGS: Uterus 10.7 x 7.0 x 5.4 cm.  Anteverted, anteflexed.  Left lateral
intramural fibroid measures 1.7 x 1.7 x 1.6 cm.  Echogenic possibly
partly calcified fibroid in the left lateral uterine body measures
0.8 x 0.7 x 0.6 cm.  Minimal, if any, mass effect upon the
endometrium.

Endometrium 4 mm.  Echogenic mass-like area in the lower uterine
segment/cervix is noted.  This measures 2.5 x 2.5 centimeters.

Right Ovary normal in size and appearance.

Left Ovary normal in size and appearance.

Other Findings:  No free fluid.
IMPRESSION: Uterine fibroids as above.  Relatively echogenic mass-like area in
the lower uterine segment could represent a fibroid, cervical
polyp, or potentially artifactual change related to prior surgery.
Visual inspection is recommended.  Otherwise, pelvic MRI with IV
contrast could be helpful for definitive characterization of
fibroid size and position.

## 2013-03-07 ENCOUNTER — Encounter: Payer: Self-pay | Admitting: *Deleted

## 2013-06-04 ENCOUNTER — Encounter: Payer: Self-pay | Admitting: Interventional Cardiology

## 2013-06-09 ENCOUNTER — Ambulatory Visit (INDEPENDENT_AMBULATORY_CARE_PROVIDER_SITE_OTHER): Payer: No Typology Code available for payment source | Admitting: Internal Medicine

## 2013-06-09 VITALS — BP 126/78 | HR 76 | Temp 98.3°F | Resp 14 | Ht 64.5 in | Wt 167.0 lb

## 2013-06-09 DIAGNOSIS — S39011A Strain of muscle, fascia and tendon of abdomen, initial encounter: Secondary | ICD-10-CM

## 2013-06-09 DIAGNOSIS — K439 Ventral hernia without obstruction or gangrene: Secondary | ICD-10-CM | POA: Insufficient documentation

## 2013-06-09 DIAGNOSIS — Z7189 Other specified counseling: Secondary | ICD-10-CM

## 2013-06-09 DIAGNOSIS — IMO0002 Reserved for concepts with insufficient information to code with codable children: Secondary | ICD-10-CM

## 2013-06-09 NOTE — Progress Notes (Signed)
   Subjective:    Patient ID: Shannon Mason, female    DOB: Aug 29, 1965, 48 y.o.   MRN: [id number]  HPI Patient presents today stating her workers Compensation claim has been denied. She indicates she is appealing this decision. She states she was injured pulling mail trays from overhead. The date of her injury was Jun 17, 2012. She does report she felt sore the day before her injury, she then reports  sharp stabbing pains. Patient indicates she did see a surgeon in [facility], but does not recall who this was. Chart review indicates she did see Dr. Zoe Lan. Dr. Zoe Lan advised her she had an abdominal strain and referred her back to [facility] for continued treatment. She now states the pain has improved with use of maternity support belt, and has returned to work. Patient does indicate she has history of a hernia repair, which occurred prior to this injury. She indicates this was done approximately 2012. She states she had recovered completely from the surgery prior to injury on May 13th, 2014. She does have history of other abdominal surgeries as well, including 4 caesarian sections, tubal ligation , an emergency open exploratory abdominal surgery (following a kidney biopsy), and a hysterectomy.  Patient presents with a statement from her workers compensation injury, this statement was prepared by her union representative. She requests Dr Elder Cyphers review her statement and provide a narrative for her to support her claim .   Explained to patient the paperwork is very difficult to complete, since only one visit here was with Dr Elder Cyphers, this was June 27th, 2014.  45 minutes spent reviewing [facility] chart and postal paperwork  She is about 70% healed from the abdomenal wall strain suffered at work last May   Review of Systems     Objective:   Physical Exam  Constitutional: She is oriented to person, place, and time. She appears well-developed and well-nourished. No distress.  HENT:    Head: Normocephalic.  Eyes: EOM are normal. No scleral icterus.  Neck: Normal range of motion.  Cardiovascular: Normal rate, regular rhythm and normal heart sounds.   Pulmonary/Chest: Effort normal and breath sounds normal.  Abdominal: Soft. Bowel sounds are normal. She exhibits no mass. There is tenderness. There is no rebound and no guarding.  Musculoskeletal: Normal range of motion.  Neurological: She is alert and oriented to person, place, and time. She exhibits normal muscle tone. Coordination normal.  Psychiatric: She has a normal mood and affect. Her behavior is normal. Judgment and thought content normal.   Patient seated comfortably on the exam table.    30 minutes composing letter    Assessment & Plan:  Letter provided for patient, please see copy in chart.

## 2013-06-09 NOTE — Progress Notes (Signed)
   Subjective:    Patient ID: Shannon Mason, female    DOB: [birth year], 48 y.o.   MRN: [id number]  HPI    Review of Systems     Objective:   Physical Exam        Assessment & Plan:

## 2013-06-09 NOTE — Patient Instructions (Signed)

## 2013-07-15 ENCOUNTER — Ambulatory Visit: Payer: Self-pay | Admitting: Interventional Cardiology

## 2013-08-06 DIAGNOSIS — N182 Chronic kidney disease, stage 2 (mild): Secondary | ICD-10-CM

## 2013-08-06 DIAGNOSIS — I5032 Chronic diastolic (congestive) heart failure: Secondary | ICD-10-CM | POA: Insufficient documentation

## 2013-08-06 DIAGNOSIS — D573 Sickle-cell trait: Secondary | ICD-10-CM

## 2013-08-06 DIAGNOSIS — I10 Essential (primary) hypertension: Secondary | ICD-10-CM | POA: Insufficient documentation

## 2013-08-17 ENCOUNTER — Ambulatory Visit
Admission: RE | Admit: 2013-08-17 | Discharge: 2013-08-17 | Disposition: A | Discharge status: Home / Self Care | Payer: PRIVATE HEALTH INSURANCE | Source: Ambulatory Visit | Attending: Family Medicine | Admitting: Family Medicine

## 2013-08-17 ENCOUNTER — Other Ambulatory Visit: Payer: Self-pay | Admitting: Family Medicine

## 2013-08-17 DIAGNOSIS — M549 Dorsalgia, unspecified: Secondary | ICD-10-CM

## 2013-08-17 DIAGNOSIS — R109 Unspecified abdominal pain: Secondary | ICD-10-CM

## 2013-08-18 ENCOUNTER — Ambulatory Visit: Payer: Self-pay | Admitting: Interventional Cardiology

## 2013-09-10 ENCOUNTER — Encounter: Payer: Self-pay | Admitting: Interventional Cardiology

## 2013-09-10 ENCOUNTER — Other Ambulatory Visit: Payer: Self-pay

## 2013-09-10 ENCOUNTER — Ambulatory Visit (INDEPENDENT_AMBULATORY_CARE_PROVIDER_SITE_OTHER): Payer: PRIVATE HEALTH INSURANCE | Admitting: Interventional Cardiology

## 2013-09-10 VITALS — BP 115/80 | HR 101 | Ht 64.0 in | Wt 162.8 lb

## 2013-09-10 DIAGNOSIS — I5032 Chronic diastolic (congestive) heart failure: Secondary | ICD-10-CM

## 2013-09-10 DIAGNOSIS — I10 Essential (primary) hypertension: Secondary | ICD-10-CM

## 2013-09-10 DIAGNOSIS — N182 Chronic kidney disease, stage 2 (mild): Secondary | ICD-10-CM

## 2013-09-10 MED ORDER — CARVEDILOL 3.125 MG PO TABS: 3.1250 mg | ORAL_TABLET | Freq: Two times a day (BID) | ORAL | Status: DC

## 2013-09-10 MED ORDER — CARVEDILOL 6.25 MG PO TABS: 3.1250 mg | ORAL_TABLET | Freq: Two times a day (BID) | ORAL | Status: DC

## 2013-09-10 MED ORDER — LOSARTAN POTASSIUM-HCTZ 100-25 MG PO TABS: 0.5000 | ORAL_TABLET | Freq: Every day | ORAL | Status: DC

## 2013-09-10 NOTE — Patient Instructions (Addendum)
Your physician recommends that you continue on your current medications as directed. Please refer to the Current Medication list given to you today.  Your physician wants you to follow-up in: 1 year with Dr.Smith You will receive a reminder letter in the mail two months in advance. If you don't receive a letter, please call our office to schedule the follow-up appointment.  

## 2013-09-10 NOTE — Progress Notes (Signed)
Patient ID: Shannon Mason, female   DOB: 09-26-1965, 48 y.o.   MRN: [id number]    [address], [address] [place], [place]  [address] Phone: [phone] Fax:  [phone]  Date:  09/10/2013   ID:  Shannon Mason, DOB [birth year], MRN [id number]  PCP:  No PCP Per Patient   ASSESSMENT:  1. Chronic diastolic heart failure. Diastolic heart failure has replaced systolic heart failure 2. Excessive daytime sleepiness 3. Chronic kidney disease  PLAN:  1. Continue medications as listed 2. Low salt diet Repair clinical followup in one year   SUBJECTIVE: Shannon Mason is a 48 y.o. female who is doing well with the exception of excessive daytime sleepiness. She denies dyspnea. No lower extremity edema. The difficulty with sleepiness has to do with driving. This is a relatively new finding over the last 3 months. She has dyspnea when lying on her back. She otherwise   Wt Readings from Last 3 Encounters:  09/10/13 162 lb 12.8 oz (73.846 kg)  06/09/13 167 lb (75.751 kg)  03/07/13 171 lb (77.565 kg)     Past Medical History  Diagnosis Date  . Hypertension   . Kidney disease     Dr Mercy Moore  . DM (diabetes mellitus) 2007    Gestational   . Postpartum cardiomyopathy     with CHF, EF 15% 07-19-2008;EF 50% on 2D echo 09-2009  . Asthma   . Fibroid     by MRI, 03-2008, s/p hysterectomy 05-2010  . Hernia, ventral 03-2008    small, by MRI   . Genital herpes     type 2  . Eczema     Current Outpatient Prescriptions  Medication Sig Dispense Refill  . acetaminophen (TYLENOL) 500 MG tablet Take 1,000 mg by mouth every 6 (six) hours as needed for pain (pain).      Marland Kitchen albuterol (PROVENTIL HFA;VENTOLIN HFA) 108 (90 BASE) MCG/ACT inhaler Inhale into the lungs every 6 (six) hours as needed for wheezing or shortness of breath.      . carvedilol (COREG) 6.25 MG tablet Take by mouth 2 (two) times daily. 1/2 tab twice daily      . cetirizine (ZYRTEC) 10 MG tablet Take 10 mg by mouth 2  (two) times daily.      . clobetasol cream (TEMOVATE) 7.61 % Apply 1 application topically 2 (two) times daily.      . ferrous fumarate (HEMOCYTE - 106 MG FE) 325 (106 FE) MG TABS Take 1 tablet by mouth daily.      . Glucosamine-Chondroitin (GLUCOSAMINE CHONDR COMPLEX PO) Take by mouth. Taking 1500-1200 mg Twice a day      . losartan-hydrochlorothiazide (HYZAAR) 100-25 MG per tablet Take 1 tablet by mouth daily. 1/2 tablet by mouth once daily      . meclizine (ANTIVERT) 25 MG tablet Take 25 mg by mouth 3 (three) times daily as needed (nausea).      . Multiple Vitamins-Calcium (ONE-A-DAY WOMENS PO) Take by mouth daily.       . promethazine (PHENERGAN) 25 MG tablet Take 25 mg by mouth at bedtime as needed for nausea (nausea).      . valACYclovir (VALTREX) 500 MG tablet Take 500 mg by mouth as needed (falre up).      . vitamin B-12 (CYANOCOBALAMIN) 100 MCG tablet Take 500 mcg by mouth daily.        No current facility-administered medications for this visit.    Allergies:    Allergies  Allergen Reactions  . Vicodin [Hydrocodone-Acetaminophen] Nausea Only    Social History:  The patient  reports that she has never smoked. She does not have any smokeless tobacco history on file. She reports that she does not drink alcohol or use illicit drugs.   ROS:  Please see the history of present illness.. No palpitations or syncope. No peripheral edema. All other systems reviewed and negative.   OBJECTIVE: VS:  BP 115/80  Pulse 101  Ht 5\' 4"  (1.626 m)  Wt 162 lb 12.8 oz (73.846 kg)  BMI 27.93 kg/m2 Well nourished, well developed, in no acute distress, appears his stated age 47: normal Neck: JVD flat. Carotid bruit absent  Cardiac:  normal S1, S2; RRR; no murmur Lungs:  clear to auscultation bilaterally, no wheezing, rhonchi or rales Abd: soft, nontender, no hepatomegaly Ext: Edema absent. Pulses 2+ Skin: warm and dry Neuro:  CNs 2-12 intact, no focal abnormalities noted  EKG:  Normal sinus  rhythm/sinus tachycardia with nonspecific ST segment and T wave flattening       Signed, Illene Labrador III, MD 09/10/2013 12:20 PM  \

## 2014-10-19 ENCOUNTER — Telehealth: Payer: Self-pay

## 2014-10-19 NOTE — Telephone Encounter (Signed)
opened in error. Rhonda Cunningham,CMA  

## 2015-01-28 ENCOUNTER — Other Ambulatory Visit: Payer: Self-pay | Admitting: Interventional Cardiology

## 2015-02-01 ENCOUNTER — Other Ambulatory Visit: Payer: Self-pay

## 2015-02-01 DIAGNOSIS — I10 Essential (primary) hypertension: Secondary | ICD-10-CM

## 2015-02-01 DIAGNOSIS — I5032 Chronic diastolic (congestive) heart failure: Secondary | ICD-10-CM

## 2015-02-01 MED ORDER — CARVEDILOL 6.25 MG PO TABS: 3.1250 mg | ORAL_TABLET | Freq: Two times a day (BID) | ORAL | Status: DC

## 2015-10-13 ENCOUNTER — Telehealth: Payer: Self-pay | Admitting: Interventional Cardiology

## 2015-10-13 DIAGNOSIS — I5032 Chronic diastolic (congestive) heart failure: Secondary | ICD-10-CM

## 2015-10-13 DIAGNOSIS — I10 Essential (primary) hypertension: Secondary | ICD-10-CM

## 2015-10-13 MED ORDER — CARVEDILOL 6.25 MG PO TABS: 3.1250 mg | ORAL_TABLET | Freq: Two times a day (BID) | ORAL | 0 refills | Status: DC

## 2015-10-13 NOTE — Telephone Encounter (Signed)
New message          *STAT* If patient is at the pharmacy, call can be transferred to refill team.   1. Which medications need to be refilled? (please list name of each medication and dose if known)  Carvedilol 6.25mg  --1/2 tab bid 2. Which pharmacy/location (including street and city if local pharmacy) is medication to be sent to? [facility] [address] 3. Do they need a 30 day or 90 day supply? 90 day Pt made an appt for 11-03-15.

## 2015-10-19 ENCOUNTER — Encounter: Payer: Self-pay | Admitting: Cardiology

## 2015-11-02 NOTE — Progress Notes (Signed)
Cardiology Office Note   Date:  11/03/2015   ID:  Shannon Mason, DOB 27-Feb-1965, MRN [id number]  PCP:  Lilian Coma, MD  Cardiologist:  Dr. Tamala Julian    Chief Complaint  Patient presents with  . Congestive Heart Failure    chronic, asymptmatic.      History of Present Illness: Shannon Mason is a 50 y.o. female who presents for diastolic HF-  She has a hx of chronic diastolic HF, diastolic has replaced systolic HF.  Also with CKD. HTN.  She has been doing well and has no complaints.  No chest pain and no SOB.  She tries to eat healthy but loves pasta.  Walks a lot with her job and she has a 51 year old so is active with her daughter and grandchildren.    Past Medical History:  Diagnosis Date  . Asthma   . DM (diabetes mellitus) ([place]) 2007   Gestational   . Eczema   . Fibroid    by MRI, 03-2008, s/p hysterectomy 05-2010  . Genital herpes    type 2  . Hernia, ventral 03-2008   small, by MRI   . Hypertension   . Kidney disease    Dr Mercy Moore  . Postpartum cardiomyopathy    with CHF, EF 15% 07-19-2008;EF 50% on 2D echo 09-2009    Past Surgical History:  Procedure Laterality Date  . ABDOMINAL HYSTERECTOMY  05-2010  . HERNIA REPAIR     2010  . TUBAL LIGATION    . US ECHOCARDIOGRAPHY     2D Echo EF 50% 09-2009     Current Outpatient Prescriptions  Medication Sig Dispense Refill  . acetaminophen (TYLENOL) 500 MG tablet Take 1,000 mg by mouth every 6 (six) hours as needed for pain (pain).    Marland Kitchen acetaminophen-codeine (TYLENOL #3) 300-30 MG tablet Take 1 tablet by mouth every 6 (six) hours as needed for pain.  0  . albuterol (PROVENTIL HFA;VENTOLIN HFA) 108 (90 BASE) MCG/ACT inhaler Inhale into the lungs every 6 (six) hours as needed for wheezing or shortness of breath.    . Calcium Carbonate-Vitamin D (CALCIUM 500 + D) 500-125 MG-UNIT TABS Take 1 tablet by mouth daily.    . carvedilol (COREG) 6.25 MG tablet Take 0.5 tablets (3.125 mg total) by mouth 2 (two) times  daily. 30 tablet 0  . cetirizine (ZYRTEC) 10 MG tablet Take 10 mg by mouth 2 (two) times daily.    . Chromium 500 MCG TABS Take 500 mcg by mouth daily.    . clobetasol cream (TEMOVATE) [id number] % Apply 1 application topically 2 (two) times daily.    . ferrous fumarate (HEMOCYTE - 106 MG FE) 325 (106 FE) MG TABS Take 1 tablet by mouth daily.    . folic acid (FOLVITE) [id number] MCG tablet Take 400 mcg by mouth daily.    . Glucosamine-Chondroitin (GLUCOSAMINE CHONDR COMPLEX PO) Take by mouth. Taking 1500-1200 mg Twice a day    . losartan-hydrochlorothiazide (HYZAAR) 100-25 MG per tablet Take 0.5 tablets by mouth daily. 1/2 tablet by mouth once daily 45 tablet 0  . Magnesium 250 MG TABS Take 250 mg by mouth daily.    . meclizine (ANTIVERT) 25 MG tablet Take 25 mg by mouth 3 (three) times daily as needed (nausea).    . Menthol, Topical Analgesic, (ICY HOT BACK EX) Apply 1 application topically 3 (three) times daily as needed (pain).    . methotrexate 50 MG/2ML injection Inject 0.6 mLs into the muscle  once a week. On Thursdays  1  . Multiple Vitamins-Calcium (ONE-A-DAY WOMENS PO) Take by mouth daily.     . Omega-3 Fatty Acids (FISH OIL PO) Take 1 capsule by mouth daily.    Marland Kitchen OTEZLA 30 MG TABS Take 30 mg by mouth 2 (two) times daily.    . promethazine (PHENERGAN) 25 MG tablet Take 25 mg by mouth at bedtime as needed for nausea (nausea).    . valACYclovir (VALTREX) 500 MG tablet Take 500 mg by mouth as needed (falre up).    . vitamin B-12 (CYANOCOBALAMIN) 100 MCG tablet Take 500 mcg by mouth daily.      No current facility-administered medications for this visit.     Allergies:   Vicodin [hydrocodone-acetaminophen]    Social History:  The patient  reports that she has never smoked. She has never used smokeless tobacco. She reports that she does not drink alcohol or use drugs.   Family History:  The patient's family history includes Breast cancer in her mother; Cardiomyopathy in her father; Congestive Heart  Failure in her father; Hypertension in her father.    ROS:  General:no colds or fevers, some weight loss  Skin:no rashes or ulcers HEENT:no blurred vision, no congestion CV:see HPI PUL:see HPI GI:no diarrhea constipation or melena, no indigestion GU:no hematuria, no dysuria MS:no joint pain, no claudication, + back pain and muscualr apin. Neuro:no syncope, no lightheadedness Endo:borderline diabetes HGB A1c 6.2 , no thyroid disease  Wt Readings from Last 3 Encounters:  11/03/15 156 lb (70.8 kg)  09/10/13 162 lb 12.8 oz (73.8 kg)  06/09/13 167 lb (75.8 kg)     PHYSICAL EXAM: VS:  BP 120/80   Pulse 82   Ht 5\' 4"  (1.626 m)   Wt 156 lb (70.8 kg)   SpO2 98%   BMI 26.78 kg/m  , BMI Body mass index is 26.78 kg/m. General:Pleasant affect, NAD Skin:Warm and dry, brisk capillary refill HEENT:normocephalic, sclera clear, mucus membranes moist Neck:supple, no JVD, no bruits  Heart:S1S2 RRR without murmur, gallup, rub or click Lungs:clear without rales, rhonchi, or wheezes [id number], non tender, + BS, do not palpate liver spleen or masses Ext:no lower ext edema, 2+ pedal pulses, 2+ radial pulses Neuro:alert and oriented, MAE, follows commands, + facial symmetry    EKG:  EKG is ordered today. The ekg ordered today demonstrates SR with non specific T wave abnormality but no changes from last year.     Recent Labs: No results found for requested labs within last 8760 hours.    Lipid Panel    Component Value Date/Time   CHOL  07/20/2008 0355    140        ATP III CLASSIFICATION:  <200     mg/dL   Desirable  200-239  mg/dL   Borderline High  >=240    mg/dL   High          TRIG 122 07/20/2008 0355   HDL 32 (L) 07/20/2008 0355   CHOLHDL 4.4 07/20/2008 0355   VLDL 24 07/20/2008 0355   LDLCALC  07/20/2008 0355    84        Total Cholesterol/HDL:CHD Risk Coronary Heart Disease Risk Table                     Men   Women  1/2 Average Risk   3.4   3.3  Average Risk       5.0    4.4  2 X Average  Risk   9.6   7.1  3 X Average Risk  23.4   11.0        Use the calculated Patient Ratio above and the CHD Risk Table to determine the patient's CHD Risk.        ATP III CLASSIFICATION (LDL):  <100     mg/dL   Optimal  100-129  mg/dL   Near or Above                    Optimal  130-159  mg/dL   Borderline  160-189  mg/dL   High  >190     mg/dL   Very High       Other studies Reviewed: Additional studies/ records that were reviewed today include: previous notes..   ASSESSMENT AND PLAN:  1. Chronic diastolic heart failure. Diastolic heart failure has replaced systolic heart failure- euvolemic, no symptoms.  We reviewed SOB and edema.  2. Chronic kidney disease 3. Borderline DM-2  4. HTN controlled.   Current medicines are reviewed with the patient today.  The patient Has no concerns regarding medicines.  The following changes have been made:  See above Labs/ tests ordered today include:see above  Disposition:   FU:  see above  Signed, Cecilie Kicks, NP  11/03/2015 10:22 AM    [address], [place], [place], [place] Phone: [phone]; Fax: [phone]

## 2015-11-03 ENCOUNTER — Ambulatory Visit (INDEPENDENT_AMBULATORY_CARE_PROVIDER_SITE_OTHER): Payer: 59 | Admitting: Cardiology

## 2015-11-03 ENCOUNTER — Encounter: Payer: Self-pay | Admitting: Cardiology

## 2015-11-03 ENCOUNTER — Encounter (INDEPENDENT_AMBULATORY_CARE_PROVIDER_SITE_OTHER): Payer: Self-pay

## 2015-11-03 VITALS — BP 120/80 | HR 82 | Ht 64.0 in | Wt 156.0 lb

## 2015-11-03 DIAGNOSIS — N182 Chronic kidney disease, stage 2 (mild): Secondary | ICD-10-CM

## 2015-11-03 DIAGNOSIS — I10 Essential (primary) hypertension: Secondary | ICD-10-CM | POA: Diagnosis not present

## 2015-11-03 DIAGNOSIS — E8881 Metabolic syndrome: Secondary | ICD-10-CM

## 2015-11-03 DIAGNOSIS — I5032 Chronic diastolic (congestive) heart failure: Secondary | ICD-10-CM

## 2015-11-03 MED ORDER — CARVEDILOL 6.25 MG PO TABS: 3.1250 mg | ORAL_TABLET | Freq: Two times a day (BID) | ORAL | 3 refills | Status: DC

## 2015-11-03 MED ORDER — LOSARTAN POTASSIUM-HCTZ 100-25 MG PO TABS: 0.5000 | ORAL_TABLET | Freq: Every day | ORAL | 3 refills | Status: DC

## 2015-11-03 NOTE — Patient Instructions (Signed)
Your physician recommends that you continue on your current medications as directed. Please refer to the Current Medication list given to you today.   Your physician wants you to follow-up in: YEAR WITH DR SMITH  You will receive a reminder letter in the mail two months in advance. If you don't receive a letter, please call our office to schedule the follow-up appointment.  

## 2015-11-07 ENCOUNTER — Telehealth: Payer: Self-pay | Admitting: Cardiology

## 2015-11-07 DIAGNOSIS — I10 Essential (primary) hypertension: Secondary | ICD-10-CM

## 2015-11-07 DIAGNOSIS — I5032 Chronic diastolic (congestive) heart failure: Secondary | ICD-10-CM

## 2015-11-07 NOTE — Telephone Encounter (Signed)
New message   Pt verbalized that she needs the medication for  Losartan-hctz 100-25mg  verified.   Pt c/o medication issue:  1. Name of [place]  2. How are you currently taking this medication (dosage and times per day)? n/a  3. Are you having a reaction (difficulty breathing--STAT)? no  4. What is your medication issue? Pt wants rn to verify the correct dosage and how the medication should be taken she said that its different from the last perscription

## 2015-11-09 ENCOUNTER — Telehealth: Payer: Self-pay | Admitting: *Deleted

## 2015-11-09 DIAGNOSIS — I5032 Chronic diastolic (congestive) heart failure: Secondary | ICD-10-CM

## 2015-11-09 DIAGNOSIS — I10 Essential (primary) hypertension: Secondary | ICD-10-CM

## 2015-11-09 MED ORDER — LOSARTAN POTASSIUM-HCTZ 100-25 MG PO TABS: 1.0000 | ORAL_TABLET | Freq: Every day | ORAL | 3 refills | Status: DC

## 2015-11-09 NOTE — Telephone Encounter (Signed)
Called pt to let her know that it is ok for her to take the Losartan 100/25 mg 1 tablet daily instead 1/2 tablet.  Pt also advised that she needed to get a BMP drawn. Pt requested a order be sent to   [facility]  Fax# [phone]

## 2015-11-09 NOTE — Telephone Encounter (Signed)
It looks like her B/P was perfect at her last OV on losartan 100/25. OK to refill Rx at that dose but we do need a BMP-I think this has already been ordered, if not please order one.  Abeer Iversen PA-C 11/09/2015 8:00 AM

## 2015-11-09 NOTE — Telephone Encounter (Signed)
[facility] on [address] called for clarification on the rx for losartan-hctz as it has two sigs. They stated that if preferred a new rx could be sent in instead of retuning their call. Thanks, MI

## 2015-11-14 ENCOUNTER — Telehealth: Payer: Self-pay | Admitting: Interventional Cardiology

## 2015-11-14 DIAGNOSIS — I10 Essential (primary) hypertension: Secondary | ICD-10-CM

## 2015-11-14 DIAGNOSIS — R7303 Prediabetes: Secondary | ICD-10-CM

## 2015-11-14 NOTE — Telephone Encounter (Signed)
Spoke with Dr. Tamala Julian and he said ok to order A1C. Advised pt ok to order.  Order faxed to : [facility]  Fax# [phone] as requested by pt.

## 2015-11-14 NOTE — Telephone Encounter (Signed)
New Message  Pt voiced needing an A1C for blood order, there are no orders in only for BMET.  Please f/u with pt

## 2015-12-22 ENCOUNTER — Encounter: Payer: Self-pay | Admitting: Interventional Cardiology

## 2016-05-24 ENCOUNTER — Encounter (INDEPENDENT_AMBULATORY_CARE_PROVIDER_SITE_OTHER): Payer: Self-pay

## 2016-05-24 ENCOUNTER — Ambulatory Visit (INDEPENDENT_AMBULATORY_CARE_PROVIDER_SITE_OTHER): Payer: 59 | Admitting: Neurology

## 2016-05-24 ENCOUNTER — Encounter: Payer: Self-pay | Admitting: Neurology

## 2016-05-24 VITALS — BP 133/90 | HR 80 | Ht 64.0 in | Wt 161.0 lb

## 2016-05-24 DIAGNOSIS — R202 Paresthesia of skin: Secondary | ICD-10-CM | POA: Diagnosis not present

## 2016-05-24 DIAGNOSIS — M542 Cervicalgia: Secondary | ICD-10-CM | POA: Diagnosis not present

## 2016-05-24 MED ORDER — DULOXETINE HCL 60 MG PO CPEP: 60.0000 mg | ORAL_CAPSULE | Freq: Every day | ORAL | 12 refills | Status: DC

## 2016-05-24 NOTE — Progress Notes (Signed)
PATIENT: Shannon Mason DOB: [birth year]  Chief Complaint  Patient presents with  . Back/Hip/Neck Pain    Reports right hip and low back pain present since 2010 that started when she was pregnant.  Also, has neck pain when turing her head to the right that has recently worsened.  Her neck pain started at age 51 with no known injury to the area.  She has failed treatment with NSAIDS.  Marland Kitchen PCP    Jonathon Jordan, MD     HISTORICAL  Shannon Mason is a 51 years old right-handed female, seen in refer by her primary care physician Dr. Jonathon Jordan for evaluation of back, hip, neck pain, initial evaluation was on May 24 2016.   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 Melissa Noon care, is now treated with methotrexate intramuscular injection every week. She has multiple joints pain, bilateral hands, knees, low back, bilateral hip pain.  She complains of difficulty sleeping, difficult to find a comfortable position, toss and turns, keep on adjusting her posture to find comfortable position, she denies bowel and bladder  incontinence.  I was able to review most recent laboratory evaluation on March 28 2016, LDL 162, glucose was 101, normal CMP, with creatinine of 0.77,normal liver functional test, A1c was 5.5, normal TSH 1.37, CBC, with hemoglobin of 12 point 3,   REVIEW OF SYSTEMS: Full 14 system review of systems performed and notable only for as above  ALLERGIES: Allergies  Allergen Reactions  . Vicodin [Hydrocodone-Acetaminophen] Nausea Only    HOME MEDICATIONS: Current Outpatient Prescriptions  Medication Sig Dispense Refill  . acetaminophen (TYLENOL) 500 MG tablet Take 1,000 mg by mouth every 6 (six) hours as needed for pain (pain).    Marland Kitchen acetaminophen-codeine (TYLENOL #3) 300-30 MG tablet Take 1 tablet by mouth every 6 (six) hours as needed for pain.  0  . Calcium Carbonate-Vitamin D (CALCIUM 500 + D) 500-125 MG-UNIT TABS Take 1 tablet by mouth daily.    . carvedilol (COREG) 6.25 MG tablet Take 0.5 tablets (3.125 mg total) by mouth 2 (two) times daily. 90 tablet 3  . cetirizine (ZYRTEC) 10 MG tablet Take 10 mg by mouth 2 (two) times daily.    . Chromium 500 MCG TABS Take 500 mcg by mouth daily.    . clobetasol cream (TEMOVATE) 7.61 % Apply 1 application topically 2 (two) times daily.    . ferrous fumarate (HEMOCYTE - 106 MG FE) 325 (106 FE) MG TABS Take 1 tablet by mouth daily.    . folic acid (FOLVITE) 607 MCG tablet Take 400 mcg by mouth daily.    . Glucosamine-Chondroitin (GLUCOSAMINE CHONDR COMPLEX PO) Take by  mouth. Taking 1500-1200 mg Twice a day    . losartan-hydrochlorothiazide (HYZAAR) 100-25 MG tablet Take 1 tablet by mouth daily. 90 tablet 3  . Magnesium 250 MG TABS Take 250 mg by mouth daily.    . meclizine (ANTIVERT) 25 MG tablet Take 25 mg by mouth 3 (three) times daily as needed (nausea).    . Menthol, Topical Analgesic, (ICY HOT BACK EX) Apply 1 application topically 3 (three) times daily as needed (pain).    . methotrexate 50 MG/2ML injection Inject 0.6 mLs into the muscle once  a week. On Thursdays  1  . Multiple Vitamins-Calcium (ONE-A-DAY WOMENS PO) Take by mouth daily.     . Omega-3 Fatty Acids (FISH OIL PO) Take 1 capsule by mouth daily.    Marland Kitchen OTEZLA 30 MG TABS Take 30 mg by mouth 2 (two) times daily.    . promethazine (PHENERGAN) 25 MG tablet Take 25 mg by mouth at bedtime as needed for nausea (nausea).    . valACYclovir (VALTREX) 500 MG tablet Take 500 mg by mouth as needed (falre up).    . vitamin B-12 (CYANOCOBALAMIN) 100 MCG tablet Take 500 mcg by mouth daily.      No current facility-administered medications for this visit.     PAST MEDICAL HISTORY: Past Medical History:  Diagnosis Date  . Asthma   . Back pain   . DM (diabetes mellitus) ([place]) 2007   Gestational   . Eczema   . Fibroid    by MRI, 03-2008, s/p hysterectomy 05-2010  . Genital herpes    type 2  . Hernia, ventral 03-2008   small, by MRI   . Hip pain   . Hypertension   . Kidney disease    Dr Mercy Moore  . Neck pain   . Postpartum cardiomyopathy    with CHF, EF 15% 07-19-2008;EF 50% on 2D echo 09-2009    PAST SURGICAL HISTORY: Past Surgical History:  Procedure Laterality Date  . ABDOMINAL HYSTERECTOMY  05-2010  . HERNIA REPAIR     2010  . OTHER SURGICAL HISTORY     Spleen repair  . RENAL BIOPSY     Benign  . TUBAL LIGATION    . US ECHOCARDIOGRAPHY     2D Echo EF 50% 09-2009    FAMILY HISTORY: Family History  Problem Relation Age of Onset  . Breast cancer Mother   . Hypertension Father   . Congestive Heart Failure Father   . Cardiomyopathy Father     has AICD  . Pneumonia Father   . Brain cancer Sister   . Breast cancer Sister   . Breast cancer Sister     SOCIAL HISTORY:  Social History   Social History  . Marital status: Married    Spouse name: N/A  . Number of children: 4  . Years of education: 2 years college   Occupational History  . Mail sorter    Social History Main Topics  . Smoking status: Never Smoker  . Smokeless tobacco: Never Used  . Alcohol  use No  . Drug use: No  . Sexual activity: Not on file   Other Topics Concern  . Not on file   Social History Narrative   Lives at home with husband.   Right-handed.   2 cups caffeine per day.        PHYSICAL EXAM   Vitals:   05/24/16 0824  BP: 133/90  Pulse: 80  Weight: 161 lb (73 kg)  Height: 5\' 4"  (  1.626 m)    Not recorded      Body mass index is 27.64 kg/m.  PHYSICAL EXAMNIATION:  Gen: NAD, conversant, well nourised, obese, well groomed                     Cardiovascular: Regular rate rhythm, no peripheral edema, warm, nontender. Eyes: Conjunctivae clear without exudates or hemorrhage Neck: Supple, no carotid bruits. Pulmonary: Clear to auscultation bilaterally   NEUROLOGICAL EXAM:  MENTAL STATUS: Speech:    Speech is normal; fluent and spontaneous with normal comprehension.  Cognition:     Orientation to time, place and person     Normal recent and remote memory     Normal Attention span and concentration     Normal Language, naming, repeating,spontaneous speech     Fund of knowledge   CRANIAL NERVES: CN II: Visual fields are full to confrontation. Fundoscopic exam is normal with sharp discs and no vascular changes. Pupils are round equal and briskly reactive to light. CN III, IV, VI: extraocular movement are normal. No ptosis. CN V: Facial sensation is intact to pinprick in all 3 divisions bilaterally. Corneal responses are intact.  CN VII: Face is symmetric with normal eye closure and smile. CN VIII: Hearing is normal to rubbing fingers CN IX, X: Palate elevates symmetrically. Phonation is normal. CN XI: Head turning and shoulder shrug are intact CN XII: Tongue is midline with normal movements and no atrophy.  MOTOR: There is no pronator drift of out-stretched arms. Muscle bulk and tone are normal. Muscle strength is normal.  REFLEXES: Reflexes are 2+ and symmetric at the biceps, triceps, knees, and ankles. Plantar responses are  flexor.  SENSORY: Intact to light touch, pinprick, positional sensation and vibratory sensation are intact in fingers and toes.  COORDINATION: Rapid alternating movements and fine finger movements are intact. There is no dysmetria on finger-to-nose and heel-knee-shin.    GAIT/STANCE: Mildly antalgic, Gait is steady with normal steps, base, arm swing, and turning. Romberg is absent.   DIAGNOSTIC DATA (LABS, IMAGING, TESTING) - I reviewed patient records, labs, notes, testing and imaging myself where available.   ASSESSMENT AND PLAN  Shannon Mason is a 51 y.o. female   History of psoriatic arthritis now presenting with worsening neck pain, radiating pain to right upper extremity  Need to rule out cervical radiculopathy   proceed with MRI of cervical spine  Cymbalta 60 mg daily    Marcial Pacas, M.D. Ph.D.  [facility] [address] [place], [place] [address] Ph: [phone] Fax: [phone]  CC: Jonathon Jordan, MD

## 2016-06-04 ENCOUNTER — Emergency Department (HOSPITAL_COMMUNITY): Payer: 59

## 2016-06-04 ENCOUNTER — Emergency Department (HOSPITAL_COMMUNITY)
Admission: EM | Admit: 2016-06-04 | Discharge: 2016-06-04 | Disposition: A | Discharge status: Home / Self Care | Payer: 59 | Attending: Emergency Medicine | Admitting: Emergency Medicine

## 2016-06-04 DIAGNOSIS — Z79899 Other long term (current) drug therapy: Secondary | ICD-10-CM | POA: Insufficient documentation

## 2016-06-04 DIAGNOSIS — I5032 Chronic diastolic (congestive) heart failure: Secondary | ICD-10-CM | POA: Insufficient documentation

## 2016-06-04 DIAGNOSIS — I110 Hypertensive heart disease with heart failure: Secondary | ICD-10-CM | POA: Insufficient documentation

## 2016-06-04 DIAGNOSIS — K2900 Acute gastritis without bleeding: Secondary | ICD-10-CM | POA: Insufficient documentation

## 2016-06-04 DIAGNOSIS — R197 Diarrhea, unspecified: Secondary | ICD-10-CM

## 2016-06-04 DIAGNOSIS — E876 Hypokalemia: Secondary | ICD-10-CM | POA: Insufficient documentation

## 2016-06-04 DIAGNOSIS — E119 Type 2 diabetes mellitus without complications: Secondary | ICD-10-CM | POA: Insufficient documentation

## 2016-06-04 DIAGNOSIS — Z91040 Latex allergy status: Secondary | ICD-10-CM | POA: Insufficient documentation

## 2016-06-04 DIAGNOSIS — J45909 Unspecified asthma, uncomplicated: Secondary | ICD-10-CM | POA: Insufficient documentation

## 2016-06-04 LAB — URINALYSIS, ROUTINE W REFLEX MICROSCOPIC
BILIRUBIN URINE: NEGATIVE
GLUCOSE, UA: NEGATIVE mg/dL
HGB URINE DIPSTICK: NEGATIVE
Ketones, ur: NEGATIVE mg/dL
Leukocytes, UA: NEGATIVE
Nitrite: NEGATIVE
PH: 5 (ref 5.0–8.0)
Protein, ur: NEGATIVE mg/dL
SPECIFIC GRAVITY, URINE: 1.021 (ref 1.005–1.030)

## 2016-06-04 LAB — COMPREHENSIVE METABOLIC PANEL
ALBUMIN: 4.4 g/dL (ref 3.5–5.0)
ALK PHOS: 79 U/L (ref 38–126)
ALT: 14 U/L (ref 14–54)
ANION GAP: 11 (ref 5–15)
AST: 18 U/L (ref 15–41)
BILIRUBIN TOTAL: 0.9 mg/dL (ref 0.3–1.2)
BUN: 22 mg/dL — ABNORMAL HIGH (ref 6–20)
CALCIUM: 9.8 mg/dL (ref 8.9–10.3)
CO2: 27 mmol/L (ref 22–32)
Chloride: 100 mmol/L — ABNORMAL LOW (ref 101–111)
Creatinine, Ser: 1 mg/dL (ref 0.44–1.00)
GFR calc non Af Amer: >60 mL/min (ref >60)
Glucose, Bld: 130 mg/dL — ABNORMAL HIGH (ref 65–99)
POTASSIUM: 3.1 mmol/L — AB (ref 3.5–5.1)
SODIUM: 138 mmol/L (ref 135–145)
TOTAL PROTEIN: 7.3 g/dL (ref 6.5–8.1)

## 2016-06-04 LAB — CBC
HEMATOCRIT: 41 % (ref 36.0–46.0)
HEMOGLOBIN: 13.2 g/dL (ref 12.0–15.0)
MCH: 24.5 pg — ABNORMAL LOW (ref 26.0–34.0)
MCHC: 32.2 g/dL (ref 30.0–36.0)
MCV: 76.2 fL — ABNORMAL LOW (ref 78.0–100.0)
Platelets: 342 10*3/uL (ref 150–400)
RBC: 5.38 MIL/uL — AB (ref 3.87–5.11)
RDW: 17.2 % — ABNORMAL HIGH (ref 11.5–15.5)
WBC: 8.3 10*3/uL (ref 4.0–10.5)

## 2016-06-04 LAB — I-STAT TROPONIN, ED: Troponin i, poc: 0 ng/mL (ref 0.00–0.08)

## 2016-06-04 LAB — LIPASE, BLOOD: Lipase: 22 U/L (ref 11–51)

## 2016-06-04 MED ORDER — ONDANSETRON HCL 4 MG/2ML IJ SOLN
4.0000 mg | Freq: Once | INTRAMUSCULAR | Status: AC
  Administered 2016-06-04: 4 mg via INTRAVENOUS
  Filled 2016-06-04: qty 2

## 2016-06-04 MED ORDER — PANTOPRAZOLE SODIUM 40 MG IV SOLR
40.0000 mg | Freq: Once | INTRAVENOUS | Status: AC
  Administered 2016-06-04: 40 mg via INTRAVENOUS
  Filled 2016-06-04: qty 40

## 2016-06-04 MED ORDER — POTASSIUM CHLORIDE CRYS ER 20 MEQ PO TBCR
40.0000 meq | EXTENDED_RELEASE_TABLET | Freq: Once | ORAL | Status: AC
  Administered 2016-06-04: 40 meq via ORAL
  Filled 2016-06-04: qty 2

## 2016-06-04 MED ORDER — FAMOTIDINE 20 MG PO TABS: 20.0000 mg | ORAL_TABLET | Freq: Two times a day (BID) | ORAL | 0 refills | Status: DC

## 2016-06-04 MED ORDER — SODIUM CHLORIDE 0.9 % IV SOLN
1000.0000 mL | INTRAVENOUS | Status: DC
  Administered 2016-06-04: 1000 mL via INTRAVENOUS

## 2016-06-04 MED ORDER — SODIUM CHLORIDE 0.9 % IV SOLN
1000.0000 mL | Freq: Once | INTRAVENOUS | Status: AC
  Administered 2016-06-04: 1000 mL via INTRAVENOUS

## 2016-06-04 MED ORDER — ONDANSETRON 4 MG PO TBDP: 4.0000 mg | ORAL_TABLET | ORAL | 0 refills | Status: DC | PRN

## 2016-06-04 MED ORDER — POTASSIUM CHLORIDE CRYS ER 20 MEQ PO TBCR: 40.0000 meq | EXTENDED_RELEASE_TABLET | Freq: Every day | ORAL | 0 refills | Status: DC

## 2016-06-04 NOTE — ED Triage Notes (Addendum)
Pt reports onset of SOB yesterday, denies throat swelling, pt reports n/v & bil & mid upper back pain, pt reports starting  Duloxetine HCL DR 60 mg tabs daily x 3 days ago, pt reports symptoms are intermittent, pain worse with movement, pt reports vomiting once today & x 2 liquid stools today, pt A&O x4

## 2016-06-04 NOTE — ED Provider Notes (Signed)
[facility] DEPT Provider Note   CSN: [id number] Arrival date & time: 06/04/16  1332     History   Chief Complaint Chief Complaint  Patient presents with  . Allergic Reaction    HPI Shannon Mason is a 51 y.o. female.  HPI Patient reports that she started having diarrhea about 3 days ago. It started with some cramping abdominal pain and gas pains. She then went on to have recurrent bowel movements. She reports she was feeling fatigued but continuing in usual activities. She did not have documented fever. She reports today she was at work and she started to get increasing nausea and sweating with upper abdominal pains. She reports that she went to the bathroom and vomited several times. She reports at that time she was feeling pain coming along her back and the sides of her chest and felt like her heart was racing. She reports she still feels very fatigued and has crampy abdominal discomfort. She contacted her physician and was referred by her primary doctor for further evaluation in the ED. Patient reports that the symptoms seemed to started since she started Cymbalta 3 days ago. She report Cymbalta was started for sharp neck pains that she gets that radiate to her back and shoulder. The patient reports she is scheduled to get an MRI within the next week for that problem. Past Medical History:  Diagnosis Date  . Asthma   . Back pain   . DM (diabetes mellitus) ([place]) 2007   Gestational   . Eczema   . Fibroid    by MRI, 03-2008, s/p hysterectomy 05-2010  . Genital herpes    type 2  . Hernia, ventral 03-2008   small, by MRI   . Hip pain   . Hypertension   . Kidney disease    Dr Mercy Moore  . Neck pain   . Postpartum cardiomyopathy    with CHF, EF 15% 07-19-2008;EF 50% on 2D echo 09-2009    Patient Active Problem List   Diagnosis Date Noted  . Neck pain 05/24/2016  . Paresthesia 05/24/2016  . Chronic diastolic heart failure ([place]) 08/06/2013  . Essential hypertension, benign  08/06/2013  . Sickle-cell trait ([place]) 08/06/2013  . Chronic kidney disease, stage II (mild) 08/06/2013  . Ventral hernia 06/09/2013    Past Surgical History:  Procedure Laterality Date  . ABDOMINAL HYSTERECTOMY  05-2010  . HERNIA REPAIR     2010  . OTHER SURGICAL HISTORY     Spleen repair  . RENAL BIOPSY     Benign  . TUBAL LIGATION    . US ECHOCARDIOGRAPHY     2D Echo EF 50% 09-2009    OB History    No data available       Home Medications    Prior to Admission medications   Medication Sig Start Date End Date Taking? Authorizing Provider  acetaminophen (TYLENOL) 500 MG tablet Take 1,000 mg by mouth every 6 (six) hours as needed for pain (pain).   Yes Historical Provider, MD  acetaminophen-codeine (TYLENOL #3) 300-30 MG tablet Take 1 tablet by mouth every 6 (six) hours as needed for pain. 08/25/15  Yes Historical Provider, MD  Calcium Carbonate-Vitamin D (CALCIUM 500 + D) 500-125 MG-UNIT TABS Take 1 tablet by mouth daily.   Yes Historical Provider, MD  carvedilol (COREG) 6.25 MG tablet Take 0.5 tablets (3.125 mg total) by mouth 2 (two) times daily. 11/03/15  Yes Isaiah Serge, NP  cetirizine (ZYRTEC) 10 MG tablet Take 10  mg by mouth daily.    Yes Historical Provider, MD  Chromium 500 MCG TABS Take 500 mcg by mouth daily.   Yes Historical Provider, MD  DULoxetine (CYMBALTA) 60 MG capsule Take 1 capsule (60 mg total) by mouth daily. 05/24/16  Yes Marcial Pacas, MD  ferrous fumarate (HEMOCYTE - 106 MG FE) 325 (106 FE) MG TABS Take 1 tablet by mouth daily.   Yes Historical Provider, MD  folic acid (FOLVITE) 169 MCG tablet Take 400 mcg by mouth daily.   Yes Historical Provider, MD  Glucosamine-Chondroitin (GLUCOSAMINE CHONDR COMPLEX PO) Take by mouth. Taking 1500-1200 mg Twice a day   Yes Historical Provider, MD  losartan-hydrochlorothiazide (HYZAAR) 100-25 MG tablet Take 1 tablet by mouth daily. 11/09/15  Yes Isaiah Serge, NP  Magnesium 250 MG TABS Take 250 mg by mouth daily.   Yes  Historical Provider, MD  meclizine (ANTIVERT) 25 MG tablet Take 25 mg by mouth 3 (three) times daily as needed (nausea).   Yes Historical Provider, MD  Menthol, Topical Analgesic, (ICY HOT BACK EX) Apply 1 application topically 3 (three) times daily as needed (pain).   Yes Historical Provider, MD  methotrexate 50 MG/2ML injection Inject 0.6 mLs into the muscle once a week. On Thursdays 10/03/15  Yes Historical Provider, MD  Multiple Vitamins-Calcium (ONE-A-DAY WOMENS PO) Take by mouth daily.    Yes Historical Provider, MD  Omega-3 Fatty Acids (FISH OIL PO) Take 1 capsule by mouth daily.   Yes Historical Provider, MD  OTEZLA 30 MG TABS Take 30 mg by mouth 2 (two) times daily. 08/11/15  Yes Historical Provider, MD  promethazine (PHENERGAN) 25 MG tablet Take 25 mg by mouth at bedtime as needed for nausea (nausea).   Yes Historical Provider, MD  Secukinumab (COSENTYX) 150 MG/ML SOSY Inject 300 mg into the skin every 30 (thirty) days.   Yes Historical Provider, MD  valACYclovir (VALTREX) 500 MG tablet Take 500 mg by mouth as needed (falre up).   Yes Historical Provider, MD  vitamin B-12 (CYANOCOBALAMIN) 100 MCG tablet Take 500 mcg by mouth daily.    Yes Historical Provider, MD  famotidine (PEPCID) 20 MG tablet Take 1 tablet (20 mg total) by mouth 2 (two) times daily. 06/04/16   Charlesetta Shanks, MD  ondansetron (ZOFRAN ODT) 4 MG disintegrating tablet Take 1 tablet (4 mg total) by mouth every 4 (four) hours as needed for nausea or vomiting. 06/04/16   Charlesetta Shanks, MD  potassium chloride SA (K-DUR,KLOR-CON) 20 MEQ tablet Take 2 tablets (40 mEq total) by mouth daily. 06/04/16   Charlesetta Shanks, MD    Family History Family History  Problem Relation Age of Onset  . Breast cancer Mother   . Hypertension Father   . Congestive Heart Failure Father   . Cardiomyopathy Father     has AICD  . Pneumonia Father   . Brain cancer Sister   . Breast cancer Sister   . Breast cancer Sister     Social History Social  History  Substance Use Topics  . Smoking status: Never Smoker  . Smokeless tobacco: Never Used  . Alcohol use No     Allergies   Adhesive [tape]; Latex; and Vicodin [hydrocodone-acetaminophen]   Review of Systems Review of Systems 10 Systems reviewed and are negative for acute change except as noted in the HPI.  Physical Exam Updated Vital Signs BP (!) 116/98 (BP Location: Right Arm)   Pulse 93   Temp 97.9 F (36.6 C) (Oral)  Resp (!) 22   Ht 5\' 4"  (1.626 m)   Wt 154 lb (69.9 kg)   SpO2 100%   BMI 26.43 kg/m   Physical Exam  Constitutional: She is oriented to person, place, and time. She appears well-developed and well-nourished. No distress.  HENT:  Head: Normocephalic and atraumatic.  Right Ear: External ear normal.  Left Ear: External ear normal.  Nose: Nose normal.  Mouth/Throat: Oropharynx is clear and moist. No oropharyngeal exudate.  Eyes: Conjunctivae and EOM are normal. Pupils are equal, round, and reactive to light.  Neck: Neck supple.  Cardiovascular: Normal rate and regular rhythm.   No murmur heard. Pulmonary/Chest: Effort normal and breath sounds normal. No respiratory distress.  Abdominal: Soft. She exhibits no distension. There is tenderness.  Mild reproducible tenderness in the left upper quadrant and mid abdomen. Patient also notes however that she had a hernia mesh for a ventral hernia and this is always somewhat tender in this area. No guarding no rebound. Lower abdomen is nontender.  Musculoskeletal: Normal range of motion. She exhibits no edema or tenderness.  Neurological: She is alert and oriented to person, place, and time. No cranial nerve deficit or sensory deficit. She exhibits normal muscle tone. Coordination normal.  Skin: Skin is warm and dry.  Psychiatric: She has a normal mood and affect.  Nursing note and vitals reviewed.    ED Treatments / Results  Labs (all labs ordered are listed, but only abnormal results are  displayed) Labs Reviewed  COMPREHENSIVE METABOLIC PANEL - Abnormal; Notable for the following:       Result Value   Potassium 3.1 (*)    Chloride 100 (*)    Glucose, Bld 130 (*)    BUN 22 (*)    All other components within normal limits  CBC - Abnormal; Notable for the following:    RBC 5.38 (*)    MCV 76.2 (*)    MCH 24.5 (*)    RDW 17.2 (*)    All other components within normal limits  LIPASE, BLOOD  URINALYSIS, ROUTINE W REFLEX MICROSCOPIC  I-STAT TROPOININ, ED    EKG  EKG Interpretation  Date/Time:  Monday June 04 2016 13:39:46 EDT Ventricular Rate:  99 PR Interval:  144 QRS Duration: 72 QT Interval:  324 QTC Calculation: 415 R Axis:   37 Text Interpretation:  Normal sinus rhythm Cannot rule out Anterior infarct , age undetermined Abnormal ECG Confirmed by Johnney Killian, MD, Jeannie Done [phone]) on 06/04/2016 6:08:09 PM       Radiology Dg Abd Acute W/chest  Result Date: 06/04/2016 CLINICAL DATA:  New onset dizziness with extremity weakness, nausea and diarrhea EXAM: DG ABDOMEN ACUTE W/ 1V CHEST COMPARISON:  08/17/2013, 07/19/2008 FINDINGS: Single-view chest demonstrates no acute infiltrate or effusion. Normal cardiomediastinal silhouette. Supine and upright views of the abdomen demonstrate no free air beneath the diaphragm. There is a nonobstructed bowel gas pattern with small to moderate stool. No abnormal calcifications. IMPRESSION: 1. No radiographic evidence for acute cardiopulmonary abnormality 2. Nonobstructed gas pattern Electronically Signed   By: Donavan Foil M.D.   On: 06/04/2016 19:05    Procedures Procedures (including critical care time)  Medications Ordered in ED Medications  0.9 %  sodium chloride infusion (1,000 mLs Intravenous New Bag/Given 06/04/16 1955)    Followed by  0.9 %  sodium chloride infusion (1,000 mLs Intravenous New Bag/Given 06/04/16 1958)  pantoprazole (PROTONIX) injection 40 mg (40 mg Intravenous Given 06/04/16 1949)  ondansetron (ZOFRAN)  injection 4 mg (  4 mg Intravenous Given 06/04/16 1947)  potassium chloride SA (K-DUR,KLOR-CON) CR tablet 40 mEq (40 mEq Oral Given 06/04/16 2001)     Initial Impression / Assessment and Plan / ED Course  I have reviewed the triage vital signs and the nursing notes.  Pertinent labs & imaging results that were available during my care of the patient were reviewed by me and considered in my medical decision making (see chart for details).  Clinical Course as of Jun 05 2138  Mon Jun 04, 2016  2016 Patient is feeling improved. Nausea resolving.  [MP]    Clinical Course User Index [MP] Charlesetta Shanks, MD     Final Clinical Impressions(s) / ED Diagnoses   Final diagnoses:  Diarrhea, unspecified type  Hypokalemia, gastrointestinal losses  Acute gastritis without hemorrhage, unspecified gastritis type   Patient's primary symptom for the first several days of illness was diarrhea. Patient has developed some mild hypokalemia. Fluids and Potassium replaced in the emergency department. Patient also had upper epigastric and left upper quadrant discomfort to palpation. I felt symptoms are most consistent with gastritis. At work today patient did develop 2 episodes of vomiting and had pain that radiated around her lower chest. There is no indication this time of pulmonary disease or cardiac disease. I will have the patient initiate Pepcid for suspected gastritis and Zofran for nausea. I suspect this illness may be incidental to the patient having started Cymbalta, however as it is primarily for chronic pain management as described by the patient, I feel she is safe to discontinue it at this time and discuss resumption with her physician based on reassessment of need. New Prescriptions New Prescriptions   FAMOTIDINE (PEPCID) 20 MG TABLET    Take 1 tablet (20 mg total) by mouth 2 (two) times daily.   ONDANSETRON (ZOFRAN ODT) 4 MG DISINTEGRATING TABLET    Take 1 tablet (4 mg total) by mouth every 4 (four)  hours as needed for nausea or vomiting.   POTASSIUM CHLORIDE SA (K-DUR,KLOR-CON) 20 MEQ TABLET    Take 2 tablets (40 mEq total) by mouth daily.     Charlesetta Shanks, MD 06/04/16 2145

## 2016-06-04 NOTE — ED Notes (Signed)
Patient transported to X-ray 

## 2016-06-04 NOTE — Discharge Instructions (Signed)
Stopped taking Cymbalta at this time. Discussed restarting it with your doctor when your symptoms are resolved.

## 2016-06-04 NOTE — ED Notes (Signed)
Rounded on patient, aware labs are ordered, reassessed BP and HR.

## 2016-06-04 NOTE — ED Notes (Signed)
Pt was sent by her PCP at [facility] for further evaluation of new onset of dizziness, extremity weakness, racing heart, sweating, nausea, diarrhea. The symptoms began after patient started cymbalta about 1.5 weeks ago

## 2016-06-08 ENCOUNTER — Ambulatory Visit
Admission: RE | Admit: 2016-06-08 | Discharge: 2016-06-08 | Disposition: A | Discharge status: Home / Self Care | Payer: Self-pay | Source: Ambulatory Visit | Attending: Neurology | Admitting: Neurology

## 2016-06-08 ENCOUNTER — Telehealth: Payer: Self-pay | Admitting: Neurology

## 2016-06-08 DIAGNOSIS — R202 Paresthesia of skin: Secondary | ICD-10-CM

## 2016-06-08 DIAGNOSIS — M542 Cervicalgia: Secondary | ICD-10-CM

## 2016-06-08 NOTE — Telephone Encounter (Signed)
Please call patient MRI of cervical spine showed multilevel mild discand foraminal canal stenosis. Will review films in detail at her next follow-up visit in May 70  IMPRESSION:  This MRI of the cervical spine without contrast shows the following: 1.   At C3-C4, there is a small midline disc herniation causing mild spinal stenosis but no spinal cord compression or nerve root compression. 2.   At C4-C5, there is central disc bulging without significant foraminal narrowing or nerve root compression. 3.   At C5-C6, there is a central disc protrusion without significant foraminal narrowing or nerve root compression. 4.   At C6-C7, there is central disc protrusion without significant foraminal narrowing or nerve root compression. 5.   The spinal cord has normal signal.

## 2016-06-11 NOTE — Telephone Encounter (Signed)
Spoke to patient - she is aware of results and will keep her follow up appt next week for further discussion.

## 2016-06-11 NOTE — Telephone Encounter (Signed)
Left another message for a return call

## 2016-06-11 NOTE — Telephone Encounter (Signed)
Left message for a return call

## 2016-06-21 ENCOUNTER — Ambulatory Visit (INDEPENDENT_AMBULATORY_CARE_PROVIDER_SITE_OTHER): Payer: 59 | Admitting: Neurology

## 2016-06-21 ENCOUNTER — Encounter: Payer: Self-pay | Admitting: Neurology

## 2016-06-21 VITALS — BP 152/99 | HR 70 | Ht 64.0 in | Wt 159.0 lb

## 2016-06-21 DIAGNOSIS — M545 Low back pain, unspecified: Secondary | ICD-10-CM | POA: Insufficient documentation

## 2016-06-21 DIAGNOSIS — M542 Cervicalgia: Secondary | ICD-10-CM

## 2016-06-21 DIAGNOSIS — R202 Paresthesia of skin: Secondary | ICD-10-CM | POA: Diagnosis not present

## 2016-06-21 DIAGNOSIS — M5440 Lumbago with sciatica, unspecified side: Secondary | ICD-10-CM | POA: Diagnosis not present

## 2016-06-21 DIAGNOSIS — G8929 Other chronic pain: Secondary | ICD-10-CM

## 2016-06-21 MED ORDER — GABAPENTIN 300 MG PO CAPS: 300.0000 mg | ORAL_CAPSULE | Freq: Three times a day (TID) | ORAL | 11 refills | Status: DC

## 2016-06-21 NOTE — Progress Notes (Signed)
PATIENT: Shannon Mason DOB: [birth year]  Chief Complaint  Patient presents with  . Numbness    She is here to discuss her MRI results.  She went the ED for to nausea, diarrhea, heart palpitations and dizziness.  She thought these symptoms were related to the duloxetine and says she was instructed to hold the medication until further discussion at her follow up.      HISTORICAL  Shannon Mason is a 51 years old right-handed female, seen in refer by her primary care physician Dr. Jonathon Jordan for evaluation of back, hip, neck pain, initial evaluation was on May 24 2016.   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 Melissa Noon care, is now treated with methotrexate intramuscular injection every week. She has multiple joints pain, bilateral hands, knees, low back, bilateral hip pain.  She complains of difficulty sleeping, difficult to find a comfortable position, toss and turns, keep on adjusting her posture to find comfortable position, she denies bowel and bladder incontinence.  I was able to review most recent  laboratory evaluation on March 28 2016, LDL 162, glucose was 101, normal CMP, with creatinine of 0.77,normal liver functional test, A1c was 5.5, normal TSH 1.37, CBC, with hemoglobin of 12 point 3,   UPDATE Jun 21 2016: Her neck pain has improved, now she complains of low back pain, radiating to both hips, difficulty sleeping at nighttime, she aslo complains of gait abnormalities especially after prolonged sitting, car driving, stress bladder incontinence.  She is taking Tylenol with Codeine as needed for severe pain,  We have personally reviewed MRI cervical in May 2018 multilevel degenerative changes, but there was no significant foraminal canal stenosis.,  REVIEW OF SYSTEMS: Full 14 system review of systems performed and notable only for as above  ALLERGIES: Allergies  Allergen Reactions  . Adhesive [Tape]   . Latex Itching  . Vicodin [Hydrocodone-Acetaminophen] Nausea Only    HOME MEDICATIONS: Current Outpatient Prescriptions  Medication Sig Dispense Refill  . acetaminophen (TYLENOL) 500 MG tablet Take 1,000 mg by mouth every 6 (six) hours as needed for pain (pain).    Marland Kitchen acetaminophen-codeine (TYLENOL #3) 300-30 MG tablet Take 1 tablet by mouth every 6 (six) hours as needed for pain.  0  . Calcium Carbonate-Vitamin D (CALCIUM 500 + D) 500-125 MG-UNIT TABS Take 1 tablet by mouth daily.    . carvedilol (COREG) 6.25 MG tablet Take 0.5 tablets (3.125 mg total) by mouth 2 (two) times daily. 90 tablet 3  . cetirizine (ZYRTEC) 10 MG tablet Take 10 mg by mouth daily.     . Chromium 500 MCG TABS Take 500 mcg by mouth daily.    Marland Kitchen  DULoxetine (CYMBALTA) 60 MG capsule Take 1 capsule (60 mg total) by mouth daily. 30 capsule 12  . famotidine (PEPCID) 20 MG tablet Take 1 tablet (20 mg total) by mouth 2 (two) times daily. 30 tablet 0  . ferrous fumarate (HEMOCYTE - 106 MG FE) 325 (106 FE) MG TABS Take 1 tablet by mouth daily.    . folic acid (FOLVITE) 034 MCG tablet Take 400 mcg by mouth daily.     . Glucosamine-Chondroitin (GLUCOSAMINE CHONDR COMPLEX PO) Take by mouth. Taking 1500-1200 mg Twice a day    . losartan-hydrochlorothiazide (HYZAAR) 100-25 MG tablet Take 1 tablet by mouth daily. 90 tablet 3  . Magnesium 250 MG TABS Take 250 mg by mouth daily.    . meclizine (ANTIVERT) 25 MG tablet Take 25 mg by mouth 3 (three) times daily as needed (nausea).    . Menthol, Topical Analgesic, (ICY HOT BACK EX) Apply 1 application topically 3 (three) times daily as needed (pain).    . methotrexate 50 MG/2ML injection Inject 0.6 mLs into the muscle once a week. On Thursdays  1  . Multiple Vitamins-Calcium (ONE-A-DAY WOMENS PO) Take by mouth daily.     . Omega-3 Fatty Acids (FISH OIL PO) Take 1 capsule by mouth daily.    . ondansetron (ZOFRAN ODT) 4 MG disintegrating tablet Take 1 tablet (4 mg total) by mouth every 4 (four) hours as needed for nausea or vomiting. 20 tablet 0  . OTEZLA 30 MG TABS Take 30 mg by mouth 2 (two) times daily.    . potassium chloride SA (K-DUR,KLOR-CON) 20 MEQ tablet Take 2 tablets (40 mEq total) by mouth daily. 6 tablet 0  . promethazine (PHENERGAN) 25 MG tablet Take 25 mg by mouth at bedtime as needed for nausea (nausea).    . Secukinumab (COSENTYX) 150 MG/ML SOSY Inject 300 mg into the skin every 30 (thirty) days.    . valACYclovir (VALTREX) 500 MG tablet Take 500 mg by mouth as needed (falre up).    . vitamin B-12 (CYANOCOBALAMIN) 100 MCG tablet Take 500 mcg by mouth daily.      No current facility-administered medications for this visit.     PAST MEDICAL HISTORY: Past Medical History:  Diagnosis Date  . Asthma   . Back pain   . DM (diabetes mellitus) ([place]) 2007   Gestational   . Eczema   . Fibroid    by MRI, 03-2008, s/p hysterectomy 05-2010  . Genital herpes    type 2  . Hernia, ventral 03-2008   small, by MRI   . Hip pain   . Hypertension   . Kidney disease    Dr Mercy Moore  . Neck pain   . Postpartum cardiomyopathy    with CHF, EF 15% 07-19-2008;EF  50% on 2D echo 09-2009    PAST SURGICAL HISTORY: Past Surgical History:  Procedure Laterality Date  . ABDOMINAL HYSTERECTOMY  05-2010  . HERNIA REPAIR     2010  . OTHER SURGICAL HISTORY     Spleen repair  . RENAL BIOPSY     Benign  . TUBAL LIGATION    . US ECHOCARDIOGRAPHY     2D Echo EF 50% 09-2009    FAMILY HISTORY: Family History  Problem Relation Age of Onset  . Breast cancer Mother   . Hypertension Father   . Congestive Heart Failure Father   . Cardiomyopathy Father        has AICD  . Pneumonia Father   . Brain cancer  Sister   . Breast cancer Sister   . Breast cancer Sister     SOCIAL HISTORY:  Social History   Social History  . Marital status: Married    Spouse name: N/A  . Number of children: 4  . Years of education: 2 years college   Occupational History  . Mail sorter    Social History Main Topics  . Smoking status: Never Smoker  . Smokeless tobacco: Never Used  . Alcohol use No  . Drug use: No  . Sexual activity: Not on file   Other Topics Concern  . Not on file   Social History Narrative   Lives at home with husband.   Right-handed.   2 cups caffeine per day.        PHYSICAL EXAM   Vitals:   06/21/16 0835  BP: (!) 152/99  Pulse: 70  Weight: 159 lb (72.1 kg)  Height: 5\' 4"  (1.626 m)    Not recorded      Body mass index is 27.29 kg/m.  PHYSICAL EXAMNIATION:  Gen: NAD, conversant, well nourised, obese, well groomed                     Cardiovascular: Regular rate rhythm, no peripheral edema, warm, nontender. Eyes: Conjunctivae clear without exudates or hemorrhage Neck: Supple, no carotid bruits. Pulmonary: Clear to auscultation bilaterally   NEUROLOGICAL EXAM:  MENTAL STATUS: Speech:    Speech is normal; fluent and spontaneous with normal comprehension.  Cognition:     Orientation to time, place and person     Normal recent and remote memory     Normal Attention span and concentration     Normal Language, naming,  repeating,spontaneous speech     Fund of knowledge   CRANIAL NERVES: CN II: Visual fields are full to confrontation. Fundoscopic exam is normal with sharp discs and no vascular changes. Pupils are round equal and briskly reactive to light. CN III, IV, VI: extraocular movement are normal. No ptosis. CN V: Facial sensation is intact to pinprick in all 3 divisions bilaterally. Corneal responses are intact.  CN VII: Face is symmetric with normal eye closure and smile. CN VIII: Hearing is normal to rubbing fingers CN IX, X: Palate elevates symmetrically. Phonation is normal. CN XI: Head turning and shoulder shrug are intact CN XII: Tongue is midline with normal movements and no atrophy.  MOTOR: There is no pronator drift of out-stretched arms. Muscle bulk and tone are normal. Muscle strength is normal.  REFLEXES: Reflexes are 2+ and symmetric at the biceps, triceps, knees, and ankles. Plantar responses are flexor.  SENSORY: Intact to light touch, pinprick, positional sensation and vibratory sensation are intact in fingers and toes.  COORDINATION: Rapid alternating movements and fine finger movements are intact. There is no dysmetria on finger-to-nose and heel-knee-shin.    GAIT/STANCE: Mildly antalgic, Gait is steady with normal steps, base, arm swing, and turning. Romberg is absent.   DIAGNOSTIC DATA (LABS, IMAGING, TESTING) - I reviewed patient records, labs, notes, testing and imaging myself where available.   ASSESSMENT AND PLAN  JAZSMIN COUSE is a 51 y.o. female   History of psoriatic arthritis  Chronic neck pain  MRI of cervical spine showed multilevel degenerative changes but no significant foraminal or canal stenosis New-onset progressive low back pain radiating pain to bilateral hip  Need to rule out lumbar radiculopathy  Proceed with MRI of lumbar  Continue Cymbalta 60 mg daily  Add on gabapentin 300  mg 3 times a day    Marcial Pacas, M.D. Ph.D.  [facility] [address] [place], [place] [address] Ph: [phone] Fax: [phone]  CC: Jonathon Jordan, MD

## 2016-06-22 ENCOUNTER — Telehealth: Payer: Self-pay | Admitting: Neurology

## 2016-06-22 DIAGNOSIS — M545 Low back pain: Secondary | ICD-10-CM

## 2016-06-22 NOTE — Addendum Note (Signed)
Addended by: Marcial Pacas on: 06/22/2016 12:23 PM   Modules accepted: Orders

## 2016-06-22 NOTE — Telephone Encounter (Signed)
Mardene Celeste called from GI stating MRI L-spine with contrast needing to be switched to MRI L-spine w/wout contrast GI radiologist/tech protocol.  Please call

## 2016-06-22 NOTE — Telephone Encounter (Signed)
I have reentered order of MRI of lumbar with and without contrast

## 2016-06-22 NOTE — Addendum Note (Signed)
Addended by: Marcial Pacas on: 06/22/2016 12:24 PM   Modules accepted: Orders

## 2016-06-25 NOTE — Telephone Encounter (Signed)
Noted thank you

## 2016-07-05 ENCOUNTER — Other Ambulatory Visit: Payer: Self-pay

## 2016-07-16 ENCOUNTER — Other Ambulatory Visit: Payer: Self-pay

## 2016-07-16 ENCOUNTER — Telehealth: Payer: Self-pay | Admitting: Neurology

## 2016-07-16 MED ORDER — DULOXETINE HCL 60 MG PO CPEP: 60.0000 mg | ORAL_CAPSULE | Freq: Every day | ORAL | 3 refills | Status: DC

## 2016-07-16 NOTE — Telephone Encounter (Addendum)
Rn call patient about her gabapentin 300 dosage. Pt stated she felt drunk and dizzy spells after taking one pill. Rn stated Dr. Krista Blue would like to decrease to 100mg  1-3 times a day. Pt stated the Cymbalta is helping her back pain. Pt would like to stop taking the gabapentin, and continue taking the Cymbalta.. Pt will call back if she needs another additional medication. Message will be sent to Dr Krista Blue.

## 2016-07-16 NOTE — Telephone Encounter (Signed)
Patient started taking gabapentin (NEURONTIN) 300 MG capsule on 07-13-16. After taking 1 pill around 6pm the night before she slept until 3:30pm the next day.

## 2016-07-16 NOTE — Telephone Encounter (Signed)
Revised. 

## 2016-07-16 NOTE — Telephone Encounter (Signed)
If she thinks gabapentin has helped her symptoms some, 300 mg might be too large of a dose for her, we may give her new prescription of gabapentin 100 mg 1-3 times each day

## 2016-07-16 NOTE — Telephone Encounter (Signed)
Rn tried calling patient. Could not leave message her vm was full.

## 2016-07-19 ENCOUNTER — Ambulatory Visit
Admission: RE | Admit: 2016-07-19 | Discharge: 2016-07-19 | Disposition: A | Discharge status: Home / Self Care | Payer: 59 | Source: Ambulatory Visit | Attending: Neurology | Admitting: Neurology

## 2016-07-19 ENCOUNTER — Other Ambulatory Visit: Payer: Self-pay | Admitting: Neurology

## 2016-07-19 DIAGNOSIS — M545 Low back pain: Secondary | ICD-10-CM

## 2016-07-20 ENCOUNTER — Telehealth: Payer: Self-pay | Admitting: Neurology

## 2016-07-20 NOTE — Telephone Encounter (Signed)
Please call patient MRI of the lumbar showed multilevel degenerative changes, but there is no evidence of significant canal or foraminal stenosis.  I will review films at her next follow-up visit on September 27 2016   IMPRESSION:  This MRI of the lumbar spine without contrast shows the following: 1.    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 2.    At L3-L4 there is mild disc bulging and minimal facet hypertrophy. There is no nerve root compression. 3.    At L4-L5, there is mild facet hypertrophy with a small right joint effusion. There is no significant foraminal or lateral recess narrowing and there is no nerve root compression or 4.    At L5-S1 (L6), there is severe right and moderate left facet hypertrophy and minimal disc bulging. There is mild foraminal narrowing but no nerve root compression.

## 2016-07-23 NOTE — Telephone Encounter (Signed)
Spoke to patient she is aware of results

## 2016-07-23 NOTE — Telephone Encounter (Signed)
Left message for a return call

## 2016-07-23 NOTE — Telephone Encounter (Signed)
Left another message for a return call

## 2016-09-27 ENCOUNTER — Ambulatory Visit (INDEPENDENT_AMBULATORY_CARE_PROVIDER_SITE_OTHER): Payer: 59 | Admitting: Neurology

## 2016-09-27 ENCOUNTER — Encounter: Payer: Self-pay | Admitting: Neurology

## 2016-09-27 VITALS — BP 138/92 | HR 103 | Ht 64.0 in | Wt 150.5 lb

## 2016-09-27 DIAGNOSIS — M5440 Lumbago with sciatica, unspecified side: Secondary | ICD-10-CM | POA: Diagnosis not present

## 2016-09-27 DIAGNOSIS — G8929 Other chronic pain: Secondary | ICD-10-CM

## 2016-09-27 DIAGNOSIS — M542 Cervicalgia: Secondary | ICD-10-CM | POA: Diagnosis not present

## 2016-09-27 NOTE — Progress Notes (Signed)
PATIENT: Shannon Mason DOB: 1965/07/24  Chief Complaint  Patient presents with  . Back Pain    Feels duloxetine is mildly helpful for her pain. She was unable to tolerate gabapentin.  She would like to review her lumbar MRI today.  Prolonged sitting makes her back pain worse.  . Neck Pain    Reports neck pain has improved.     HISTORICAL  Shannon Mason is a 51 years old right-handed female, seen in refer by her primary care physician Dr. Jonathon Jordan for evaluation of back, hip, neck pain, initial evaluation was on May 24 2016.   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 Melissa Noon care, is now treated with methotrexate intramuscular injection every week. She has multiple joints pain, bilateral hands, knees, low back, bilateral hip pain.  She complains of difficulty sleeping, difficult to find a comfortable position, toss and turns, keep on adjusting her posture to find comfortable position, she denies bowel and bladder incontinence.  I was able to review most recent laboratory evaluation on March 28 2016, LDL  162, glucose was 101, normal CMP, with creatinine of 0.77,normal liver functional test, A1c was 5.5, normal TSH 1.37, CBC, with hemoglobin of 12 point 3,   UPDATE Jun 21 2016: Her neck pain has improved, now she complains of low back pain, radiating to both hips, difficulty sleeping at nighttime, she aslo complains of gait abnormalities especially after prolonged sitting, car driving, stress bladder incontinence.  She is taking Tylenol with Codeine as needed for severe pain,  We have personally reviewed MRI cervical in May 2018 multilevel degenerative changes, but there was no significant foraminal canal stenosis.,  UPDATE September 27 2016: She continue complains of multiple joints pain, gait abnormality due to pain,  We have personally reviewed MRI lumbar spine in June 2018, evidence of multilevel degenerative disease, but there was no significant foraminal canal stenosis.  She is taking Cymbalta 60 mg daily, which has helped her some,  REVIEW OF SYSTEMS: Full 14 system review of systems performed and notable only for as above  ALLERGIES: Allergies  Allergen Reactions  . Latex Itching  . Vicodin [Hydrocodone-Acetaminophen] Nausea Only  . Adhesive [Tape] Itching and Rash    HOME MEDICATIONS: Current Outpatient Prescriptions  Medication Sig Dispense Refill  . acetaminophen (TYLENOL) 500 MG tablet Take 1,000 mg by mouth every 6 (six) hours as needed for pain (pain).    Marland Kitchen acetaminophen-codeine (TYLENOL #3) 300-30 MG tablet Take 1 tablet by mouth every 6 (six) hours as needed for pain.  0  . Calcium Carbonate-Vitamin D (CALCIUM 500 + D) 500-125 MG-UNIT TABS Take 1 tablet by mouth daily.    Marland Kitchen  carvedilol (COREG) 6.25 MG tablet Take 0.5 tablets (3.125 mg total) by mouth 2 (two) times daily. 90 tablet 3  . cetirizine (ZYRTEC) 10 MG tablet Take 10 mg by mouth daily.     . Chromium 500 MCG TABS Take 500 mcg by mouth daily.    . DULoxetine (CYMBALTA) 60 MG capsule Take 1 capsule (60 mg total)  by mouth daily. 90 capsule 3  . ferrous fumarate (HEMOCYTE - 106 MG FE) 325 (106 FE) MG TABS Take 1 tablet by mouth daily.    . folic acid (FOLVITE) 502 MCG tablet Take 400 mcg by mouth daily.    . Glucosamine-Chondroitin (GLUCOSAMINE CHONDR COMPLEX PO) Take by mouth. Taking 1500-1200 mg Twice a day    . losartan-hydrochlorothiazide (HYZAAR) 100-25 MG tablet Take 1 tablet by mouth daily. 90 tablet 3  . Magnesium 250 MG TABS Take 250 mg by mouth daily.    . meclizine (ANTIVERT) 25 MG tablet Take 25 mg by mouth 3 (three) times daily as needed (nausea).    . Menthol, Topical Analgesic, (ICY HOT BACK EX) Apply 1 application topically 3 (three) times daily as needed (pain).    . methotrexate 50 MG/2ML injection Inject 0.6 mLs into the muscle once a week. On Thursdays  1  . Multiple Vitamins-Calcium (ONE-A-DAY WOMENS PO) Take by mouth daily.     . Omega-3 Fatty Acids (FISH OIL PO) Take 1 capsule by mouth daily.    . ondansetron (ZOFRAN ODT) 4 MG disintegrating tablet Take 1 tablet (4 mg total) by mouth every 4 (four) hours as needed for nausea or vomiting. 20 tablet 0  . OTEZLA 30 MG TABS Take 30 mg by mouth 2 (two) times daily.    . promethazine (PHENERGAN) 25 MG tablet Take 25 mg by mouth at bedtime as needed for nausea (nausea).    . Secukinumab (COSENTYX) 150 MG/ML SOSY Inject 300 mg into the skin every 30 (thirty) days.    . valACYclovir (VALTREX) 500 MG tablet Take 500 mg by mouth as needed (falre up).    . vitamin B-12 (CYANOCOBALAMIN) 100 MCG tablet Take 500 mcg by mouth daily.      No current facility-administered medications for this visit.     PAST MEDICAL HISTORY: Past Medical History:  Diagnosis Date  . Asthma   . Back pain   . DM (diabetes mellitus) ([place]) 2007   Gestational   . Eczema   . Fibroid    by MRI, 03-2008, s/p hysterectomy 05-2010  . Genital herpes    type 2  . Hernia, ventral 03-2008   small, by MRI   . Hip pain   . Hypertension   . Kidney disease    Dr Mercy Moore   . Neck pain   . Postpartum cardiomyopathy    with CHF, EF 15% 07-19-2008;EF 50% on 2D echo 09-2009    PAST SURGICAL HISTORY: Past Surgical History:  Procedure Laterality Date  . ABDOMINAL HYSTERECTOMY  05-2010  . HERNIA REPAIR     2010  . OTHER SURGICAL HISTORY     Spleen repair  . RENAL BIOPSY     Benign  . TUBAL LIGATION    . US ECHOCARDIOGRAPHY     2D Echo EF 50% 09-2009    FAMILY HISTORY: Family History  Problem Relation Age of Onset  . Breast cancer Mother   . Hypertension Father   . Congestive Heart Failure Father   . Cardiomyopathy Father        has AICD  .  Pneumonia Father   . Brain cancer Sister   . Breast cancer Sister   . Breast cancer Sister     SOCIAL HISTORY:  Social History   Social History  . Marital status: Married    Spouse name: N/A  . Number of children: 4  . Years of education: 2 years college   Occupational History  . Mail sorter    Social History Main Topics  . Smoking status: Never Smoker  . Smokeless tobacco: Never Used  . Alcohol use No  . Drug use: No  . Sexual activity: Not on file   Other Topics Concern  . Not on file   Social History Narrative   Lives at home with husband.   Right-handed.   2 cups caffeine per day.        PHYSICAL EXAM   Vitals:   09/27/16 1156  BP: (!) 138/92  Pulse: (!) 103  Weight: 150 lb 8 oz (68.3 kg)  Height: 5\' 4"  (1.626 m)    Not recorded      Body mass index is 25.83 kg/m.  PHYSICAL EXAMNIATION:  Gen: NAD, conversant, well nourised, obese, well groomed                     Cardiovascular: Regular rate rhythm, no peripheral edema, warm, nontender. Eyes: Conjunctivae clear without exudates or hemorrhage Neck: Supple, no carotid bruits. Pulmonary: Clear to auscultation bilaterally   NEUROLOGICAL EXAM:  MENTAL STATUS: Speech:    Speech is normal; fluent and spontaneous with normal comprehension.  Cognition:     Orientation to time, place and person     Normal recent and  remote memory     Normal Attention span and concentration     Normal Language, naming, repeating,spontaneous speech     Fund of knowledge   CRANIAL NERVES: CN II: Visual fields are full to confrontation. Fundoscopic exam is normal with sharp discs and no vascular changes. Pupils are round equal and briskly reactive to light. CN III, IV, VI: extraocular movement are normal. No ptosis. CN V: Facial sensation is intact to pinprick in all 3 divisions bilaterally. Corneal responses are intact.  CN VII: Face is symmetric with normal eye closure and smile. CN VIII: Hearing is normal to rubbing fingers CN IX, X: Palate elevates symmetrically. Phonation is normal. CN XI: Head turning and shoulder shrug are intact CN XII: Tongue is midline with normal movements and no atrophy.  MOTOR: There is no pronator drift of out-stretched arms. Muscle bulk and tone are normal. Muscle strength is normal.  REFLEXES: Reflexes are 2+ and symmetric at the biceps, triceps, knees, and ankles. Plantar responses are flexor.  SENSORY: Intact to light touch, pinprick, positional sensation and vibratory sensation are intact in fingers and toes.  COORDINATION: Rapid alternating movements and fine finger movements are intact. There is no dysmetria on finger-to-nose and heel-knee-shin.    GAIT/STANCE: Mildly antalgic, Gait is steady with normal steps, base, arm swing, and turning. Romberg is absent.   DIAGNOSTIC DATA (LABS, IMAGING, TESTING) - I reviewed patient records, labs, notes, testing and imaging myself where available.   ASSESSMENT AND PLAN  FARYN SIEG is a 51 y.o. female   History of psoriatic arthritis  Chronic neck pain  MRI of cervical spine showed multilevel degenerative changes but no significant foraminal or canal stenosis Progressive worsening low back pain, radiating pain to bilateral hip  There is no evidence of radiculopathy based on MRI of lumbar spine, examination  She is to  continue Cymbalta 60 mg daily, NSAIDs as needed,  She desired further pain management, refer her to pain clinic  Marcial Pacas, M.D. Ph.D.  [facility] [address] [place], [place] [address] Ph: [phone] Fax: [phone]  CC: Jonathon Jordan, MD

## 2016-09-27 NOTE — Patient Instructions (Signed)
[facility]: Felecia Jan MD 3.7  Pain management physician in [place], [place] Address: [address], [place], [place] [address]   Phone: [phone]

## 2016-11-09 ENCOUNTER — Other Ambulatory Visit: Payer: Self-pay | Admitting: Cardiology

## 2016-11-09 DIAGNOSIS — I10 Essential (primary) hypertension: Secondary | ICD-10-CM

## 2016-11-09 DIAGNOSIS — I5032 Chronic diastolic (congestive) heart failure: Secondary | ICD-10-CM

## 2016-12-03 ENCOUNTER — Telehealth: Payer: Self-pay | Admitting: Neurology

## 2016-12-03 NOTE — Telephone Encounter (Signed)
[facility] Has been trying to contact patient since august to schedule her pain mgt. Apt. Patient has not returned any calls. I called and left patient as well.

## 2016-12-14 ENCOUNTER — Other Ambulatory Visit: Payer: Self-pay | Admitting: Cardiology

## 2016-12-14 DIAGNOSIS — I10 Essential (primary) hypertension: Secondary | ICD-10-CM

## 2016-12-14 DIAGNOSIS — I5032 Chronic diastolic (congestive) heart failure: Secondary | ICD-10-CM

## 2016-12-17 NOTE — Telephone Encounter (Signed)
°*  STAT* If patient is at the pharmacy, call can be transferred to refill team.   1. Which medications need to be refilled? (please list name of each medication and dose if known) lasartin 100-25mg   2. Which pharmacy/location (including street and city if local pharmacy) is medication to be sent to? [facility] [address]  3. Do they need a 30 day or 90 day supply? [place]

## 2017-01-08 NOTE — Progress Notes (Signed)
Cardiology Office Note   Date:  01/10/2017   ID:  Shannon Mason, DOB 1965/12/14, MRN [id number]  PCP:  Jonathon Jordan, MD  Cardiologist:  Dr. Tamala Julian     Chief Complaint  Patient presents with  . Cardiomyopathy    Chronic diastolic HF      History of Present Illness: Shannon Mason is a 51 y.o. female who presents for diastolic HF  She has a hx of chronic diastolic HF, diastolic has replaced systolic HF.  Also with CKD. HTN.    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  Today she is doing well without complaints.  No chest pain and no SOB.  She works at [employer] and is very active without any problems.  Her BP is elevated today but she has not taken her meds.  She tries to eat healthy.   Past Medical History:  Diagnosis Date  . Asthma   . Back pain   . DM (diabetes mellitus) ([place]) 2007   Gestational   . Eczema   . Fibroid    by MRI, 03-2008, s/p hysterectomy 05-2010  . Genital herpes    type 2  . Hernia, ventral 03-2008   small, by MRI   . Hip pain   . Hypertension   . Kidney disease    Dr Mercy Moore  . Neck pain   . Postpartum cardiomyopathy    with CHF, EF 15% 07-19-2008;EF 50% on 2D echo 09-2009    Past Surgical History:  Procedure Laterality Date  . ABDOMINAL HYSTERECTOMY  05-2010  . HERNIA REPAIR     2010  . OTHER SURGICAL HISTORY     Spleen repair  . RENAL BIOPSY     Benign  . TUBAL LIGATION    . US ECHOCARDIOGRAPHY     2D Echo EF 50% 09-2009     Current Outpatient Medications  Medication Sig Dispense Refill  . acetaminophen (TYLENOL) 500 MG tablet Take 1,000 mg by mouth every 6 (six) hours as needed for pain (pain).    Marland Kitchen acetaminophen-codeine (TYLENOL #3) 300-30 MG tablet Take 1 tablet by mouth every 6 (six) hours as needed for pain.  0  . Calcium Carbonate-Vitamin D (CALCIUM 500 + D) 500-125 MG-UNIT TABS Take 1 tablet  by mouth daily.    . carvedilol (COREG) 6.25 MG tablet Take 0.5 tablets (3.125 mg total) by mouth 2 (two) times daily. 180 tablet 3  . cetirizine (ZYRTEC) 10 MG tablet Take 10 mg by mouth daily.     . Chromium 500 MCG TABS Take 500 mcg by mouth daily.    . DULoxetine (CYMBALTA) 60 MG capsule Take 1 capsule (60 mg total) by mouth daily. 90 capsule 3  . ferrous fumarate (HEMOCYTE - 106 MG FE) 325 (106 FE) MG TABS Take 1 tablet by mouth daily.    . folic acid (FOLVITE) 562 MCG tablet Take 400 mcg by mouth daily.    . Glucosamine-Chondroitin (GLUCOSAMINE CHONDR COMPLEX PO) Take by mouth. Taking 1500-1200 mg Twice a day    . losartan-hydrochlorothiazide (HYZAAR) 100-25 MG tablet Take 1 tablet by mouth daily. 90 tablet 3  . Magnesium 250 MG TABS Take 250 mg by mouth daily.    . meclizine (ANTIVERT) 25 MG tablet Take 25 mg by mouth 3 (three) times daily as needed (nausea).    . Menthol, Topical Analgesic, (ICY HOT BACK EX)  Apply 1 application topically 3 (three) times daily as needed (pain).    . methotrexate (RHEUMATREX) 2.5 MG tablet Take 6 tablets by mouth once a week. On Thursdays  1  . Multiple Vitamins-Calcium (ONE-A-DAY WOMENS PO) Take by mouth daily.     . Omega-3 Fatty Acids (FISH OIL PO) Take 1 capsule by mouth daily.    . ondansetron (ZOFRAN ODT) 4 MG disintegrating tablet Take 1 tablet (4 mg total) by mouth every 4 (four) hours as needed for nausea or vomiting. 20 tablet 0  . OTEZLA 30 MG TABS Take 30 mg by mouth 2 (two) times daily.    . Secukinumab (COSENTYX) 150 MG/ML SOSY Inject 300 mg into the skin every 30 (thirty) days.    . valACYclovir (VALTREX) 500 MG tablet Take 500 mg by mouth as needed (falre up).    . vitamin B-12 (CYANOCOBALAMIN) 100 MCG tablet Take 500 mcg by mouth daily.      No current facility-administered medications for this visit.     Allergies:   Aleve [naproxen]; Latex; Vicodin [hydrocodone-acetaminophen]; and Adhesive [tape]    Social History:  The patient   reports that  has never smoked. she has never used smokeless tobacco. She reports that she does not drink alcohol or use drugs.   Family History:  The patient's family history includes Brain cancer in her sister; Breast cancer in her mother, sister, and sister; Cardiomyopathy in her father; Congestive Heart Failure in her father; Hypertension in her father; Pneumonia in her father.    ROS:  General:no colds or fevers, no weight changes Skin:no rashes or ulcers HEENT:no blurred vision, no congestion CV:see HPI PUL:see HPI GI:no diarrhea constipation or melena, no indigestion GU:no hematuria, no dysuria MS:no joint pain, no claudication Neuro:no syncope, no lightheadedness Endo:no diabetes, no thyroid disease  Wt Readings from Last 3 Encounters:  01/10/17 152 lb 1.9 oz (69 kg)  09/27/16 150 lb 8 oz (68.3 kg)  06/21/16 159 lb (72.1 kg)     PHYSICAL EXAM: VS:  BP (!) 130/98   Pulse 96   Ht 5\' 4"  (1.626 m)   Wt 152 lb 1.9 oz (69 kg)   SpO2 97%   BMI 26.11 kg/m  , BMI Body mass index is 26.11 kg/m. General:Pleasant affect, NAD Skin:Warm and dry, brisk capillary refill HEENT:normocephalic, sclera clear, mucus membranes moist Neck:supple, no JVD, no bruits  Heart:S1S2 RRR without murmur, gallup, rub or click Lungs:clear without rales, rhonchi, or wheezes YDX:AJOI, non tender, + BS, do not palpate liver spleen or masses Ext:no lower ext edema, 2+ pedal pulses, 2+ radial pulses Neuro:alert and oriented, MAE, follows commands, + facial symmetry    EKG:  EKG is NOT ordered today.    Recent Labs: 06/04/2016: ALT 14; BUN 22; Creatinine, Ser 1.00; Hemoglobin 13.2; Platelets 342; Potassium 3.1; Sodium 138    Lipid Panel    Component Value Date/Time   CHOL  07/20/2008 0355    140        ATP III CLASSIFICATION:  <200     mg/dL   Desirable  200-239  mg/dL   Borderline High  >=240    mg/dL   High          TRIG 122 07/20/2008 0355   HDL 32 (L) 07/20/2008 0355   CHOLHDL 4.4  07/20/2008 0355   VLDL 24 07/20/2008 0355   LDLCALC  07/20/2008 0355    84        Total Cholesterol/HDL:CHD Risk Coronary Heart Disease  Risk Table                     Men   Women  1/2 Average Risk   3.4   3.3  Average Risk       5.0   4.4  2 X Average Risk   9.6   7.1  3 X Average Risk  23.4   11.0        Use the calculated Patient Ratio above and the CHD Risk Table to determine the patient's CHD Risk.        ATP III CLASSIFICATION (LDL):  <100     mg/dL   Optimal  100-129  mg/dL   Near or Above                    Optimal  130-159  mg/dL   Borderline  160-189  mg/dL   High  >190     mg/dL   Very High       Other studies Reviewed: Additional studies/ records that were reviewed today include: no recent in chart.   ASSESSMENT AND PLAN:  1.  Chronic diastolic HF -euvolemic today.  No symptoms follow up with Dr. Tamala Julian in 6 months.   2.  CKD followed by PCP  3.  HTN elevated today but has not had meds.     Current medicines are reviewed with the patient today.  The patient Has no concerns regarding medicines.  The following changes have been made:  See above Labs/ tests ordered today include:see above  Disposition:   FU:  see above  Signed, Cecilie Kicks, NP  01/10/2017 5:43 PM    [facility] [place], [place], [address] [place], [place] Phone: [phone]; Fax: [phone]

## 2017-01-10 ENCOUNTER — Ambulatory Visit (INDEPENDENT_AMBULATORY_CARE_PROVIDER_SITE_OTHER): Payer: 59 | Admitting: Cardiology

## 2017-01-10 ENCOUNTER — Encounter: Payer: Self-pay | Admitting: Cardiology

## 2017-01-10 VITALS — BP 130/98 | HR 96 | Ht 64.0 in | Wt 152.1 lb

## 2017-01-10 DIAGNOSIS — N183 Chronic kidney disease, stage 3 unspecified: Secondary | ICD-10-CM

## 2017-01-10 DIAGNOSIS — I5032 Chronic diastolic (congestive) heart failure: Secondary | ICD-10-CM

## 2017-01-10 DIAGNOSIS — I10 Essential (primary) hypertension: Secondary | ICD-10-CM | POA: Diagnosis not present

## 2017-01-10 MED ORDER — LOSARTAN POTASSIUM-HCTZ 100-25 MG PO TABS: 1.0000 | ORAL_TABLET | Freq: Every day | ORAL | 3 refills | Status: DC

## 2017-01-10 MED ORDER — CARVEDILOL 6.25 MG PO TABS: 3.1250 mg | ORAL_TABLET | Freq: Two times a day (BID) | ORAL | 3 refills | Status: DC

## 2017-01-10 NOTE — Patient Instructions (Signed)
Medication Instructions:  1. Your physician recommends that you continue on your current medications as directed. Please refer to the Current Medication list given to you today.  2. REFILLS HAVE BEEN SENT IN  FOR COREG AND HYZAAR  Labwork: NONE ORDERED TODAY  Testing/Procedures: NONE ORDERED TODAY  Follow-Up: Your physician wants you to follow-up in: [place] DR. Gaspar Bidding will receive a reminder letter in the mail two months in advance. If you don't receive a letter, please call our office to schedule the follow-up appointment.   Any Other Special Instructions Will Be Listed Below (If Applicable).     If you need a refill on your cardiac medications before your next appointment, please call your pharmacy.

## 2017-02-04 ENCOUNTER — Encounter (HOSPITAL_BASED_OUTPATIENT_CLINIC_OR_DEPARTMENT_OTHER): Payer: Self-pay

## 2017-02-04 ENCOUNTER — Other Ambulatory Visit: Payer: Self-pay

## 2017-02-04 ENCOUNTER — Emergency Department (HOSPITAL_BASED_OUTPATIENT_CLINIC_OR_DEPARTMENT_OTHER)
Admission: EM | Admit: 2017-02-04 | Discharge: 2017-02-04 | Disposition: A | Discharge status: Home / Self Care | Payer: 59 | Attending: Emergency Medicine | Admitting: Emergency Medicine

## 2017-02-04 DIAGNOSIS — I5032 Chronic diastolic (congestive) heart failure: Secondary | ICD-10-CM | POA: Diagnosis not present

## 2017-02-04 DIAGNOSIS — R6889 Other general symptoms and signs: Secondary | ICD-10-CM

## 2017-02-04 DIAGNOSIS — R51 Headache: Secondary | ICD-10-CM | POA: Diagnosis not present

## 2017-02-04 DIAGNOSIS — Z79899 Other long term (current) drug therapy: Secondary | ICD-10-CM | POA: Diagnosis not present

## 2017-02-04 DIAGNOSIS — E1122 Type 2 diabetes mellitus with diabetic chronic kidney disease: Secondary | ICD-10-CM | POA: Insufficient documentation

## 2017-02-04 DIAGNOSIS — N182 Chronic kidney disease, stage 2 (mild): Secondary | ICD-10-CM | POA: Insufficient documentation

## 2017-02-04 DIAGNOSIS — J029 Acute pharyngitis, unspecified: Secondary | ICD-10-CM | POA: Diagnosis not present

## 2017-02-04 DIAGNOSIS — R0981 Nasal congestion: Secondary | ICD-10-CM | POA: Insufficient documentation

## 2017-02-04 DIAGNOSIS — Z91040 Latex allergy status: Secondary | ICD-10-CM | POA: Insufficient documentation

## 2017-02-04 DIAGNOSIS — I130 Hypertensive heart and chronic kidney disease with heart failure and stage 1 through stage 4 chronic kidney disease, or unspecified chronic kidney disease: Secondary | ICD-10-CM | POA: Diagnosis not present

## 2017-02-04 LAB — RAPID STREP SCREEN (MED CTR MEBANE ONLY): STREPTOCOCCUS, GROUP A SCREEN (DIRECT): NEGATIVE

## 2017-02-04 MED ORDER — ONDANSETRON HCL 4 MG/2ML IJ SOLN
4.0000 mg | Freq: Once | INTRAMUSCULAR | Status: AC
  Administered 2017-02-04: 4 mg via INTRAVENOUS
  Filled 2017-02-04: qty 2

## 2017-02-04 MED ORDER — ACETAMINOPHEN 325 MG PO TABS
650.0000 mg | ORAL_TABLET | Freq: Once | ORAL | Status: AC
  Administered 2017-02-04: 650 mg via ORAL
  Filled 2017-02-04: qty 2

## 2017-02-04 MED ORDER — OSELTAMIVIR PHOSPHATE 75 MG PO CAPS: 75.0000 mg | ORAL_CAPSULE | Freq: Two times a day (BID) | ORAL | 0 refills | Status: DC

## 2017-02-04 MED ORDER — SODIUM CHLORIDE 0.9 % IV BOLUS (SEPSIS)
500.0000 mL | Freq: Once | INTRAVENOUS | Status: AC
  Administered 2017-02-04: 500 mL via INTRAVENOUS

## 2017-02-04 MED ORDER — GUAIFENESIN-CODEINE 100-10 MG/5ML PO SYRP: 5.0000 mL | ORAL_SOLUTION | Freq: Three times a day (TID) | ORAL | 0 refills | Status: DC | PRN

## 2017-02-04 MED ORDER — DIPHENHYDRAMINE HCL 50 MG/ML IJ SOLN
12.5000 mg | Freq: Once | INTRAMUSCULAR | Status: AC
  Administered 2017-02-04: 12.5 mg via INTRAVENOUS
  Filled 2017-02-04: qty 1

## 2017-02-04 MED ORDER — BENZONATATE 100 MG PO CAPS: 100.0000 mg | ORAL_CAPSULE | Freq: Three times a day (TID) | ORAL | 0 refills | Status: DC

## 2017-02-04 MED ORDER — ONDANSETRON 4 MG PO TBDP: 4.0000 mg | ORAL_TABLET | Freq: Three times a day (TID) | ORAL | 0 refills | Status: DC | PRN

## 2017-02-04 MED FILL — VIRTUSSIN AC LIQUID: 100-10 | 8 days supply | Qty: 120 | Fill #0

## 2017-02-04 MED FILL — OSELTAMIVIR PHOS 75 MG CAP: 75 | 5 days supply | Qty: 10 | Fill #0

## 2017-02-04 MED FILL — ONDANSETRON ODT 4 MG TABLET: 4 | 6 days supply | Qty: 18 | Fill #0

## 2017-02-04 MED FILL — BENZONATATE 100 MG CAPSULE: 100 | 7 days supply | Qty: 21 | Fill #0

## 2017-02-04 NOTE — ED Provider Notes (Signed)
[facility] EMERGENCY DEPARTMENT Provider Note   CSN: [id number] Arrival date & time: 02/04/17  0909     History   Chief Complaint Chief Complaint  Patient presents with  . Generalized Body Aches    HPI Shannon Mason is a 51 y.o. female.  HPI   Ms. Shannon Mason is a 51yo female with a history of hypertension, CKD stage II, diastolic heart failure who presents to the emergency department for evaluation of headache, cough, sore throat, congestion, nausea, body aches.  Patient states that her symptoms began yesterday.  At this time she has a bitemporal headache which is 8/10 in severity and aching in nature.  She has associated photophobia with her headache which she reports is typical of her migraine headaches.  She also complains of a 6/10 severity sore throat, worsened with swallowing.  Her cough is nonproductive, "hacking."  No associated shortness of breath, wheezing, chest pain.  She states that she had a low-grade fever last night.  Also endorses congestion and postnasal drip.  She has had some nausea, although no abdominal pain.  Had one episode of vomitus last night but has been otherwise able to tolerate p.o. fluids at this morning.  Denies sick contacts at home.  Reports that she did get her flu shot this year.  Denies neck stiffness, numbness, weakness, visual disturbance.  Past Medical History:  Diagnosis Date  . Asthma   . Back pain   . DM (diabetes mellitus) ([place]) 2007   Gestational   . Eczema   . Fibroid    by MRI, 03-2008, s/p hysterectomy 05-2010  . Genital herpes    type 2  . Hernia, ventral 03-2008   small, by MRI   . Hip pain   . Hypertension   . Kidney disease    Dr Mercy Moore  . Neck pain   . Postpartum cardiomyopathy    with CHF, EF 15% 07-19-2008;EF 50% on 2D echo 09-2009    Patient Active Problem List   Diagnosis Date Noted  . Low back pain 06/21/2016  . Neck pain 05/24/2016  . Paresthesia 05/24/2016  . Chronic diastolic heart failure  ([place]) 08/06/2013  . Essential hypertension, benign 08/06/2013  . Sickle-cell trait ([place]) 08/06/2013  . Chronic kidney disease, stage II (mild) 08/06/2013  . Ventral hernia 06/09/2013    Past Surgical History:  Procedure Laterality Date  . ABDOMINAL HYSTERECTOMY  05-2010  . HERNIA REPAIR     2010  . OTHER SURGICAL HISTORY     Spleen repair  . RENAL BIOPSY     Benign  . TUBAL LIGATION    . US ECHOCARDIOGRAPHY     2D Echo EF 50% 09-2009    OB History    No data available       Home Medications    Prior to Admission medications   Medication Sig Start Date End Date Taking? Authorizing Provider  acetaminophen (TYLENOL) 500 MG tablet Take 1,000 mg by mouth every 6 (six) hours as needed for pain (pain).    [provider]  acetaminophen-codeine (TYLENOL #3) 300-30 MG tablet Take 1 tablet by mouth every 6 (six) hours as needed for pain. 08/25/15   [provider]  Calcium Carbonate-Vitamin D (CALCIUM 500 + D) 500-125 MG-UNIT TABS Take 1 tablet by mouth daily.    [provider]  carvedilol (COREG) 6.25 MG tablet Take 0.5 tablets (3.125 mg total) by mouth 2 (two) times daily. 01/10/17   Isaiah Serge, NP  cetirizine (  ZYRTEC) 10 MG tablet Take 10 mg by mouth daily.     [provider]  Chromium 500 MCG TABS Take 500 mcg by mouth daily.    [provider]  DULoxetine (CYMBALTA) 60 MG capsule Take 1 capsule (60 mg total) by mouth daily. 07/16/16   Marcial Pacas, MD  ferrous fumarate (HEMOCYTE - 106 MG FE) 325 (106 FE) MG TABS Take 1 tablet by mouth daily.    [provider]  folic acid (FOLVITE) 478 MCG tablet Take 400 mcg by mouth daily.    [provider]  Glucosamine-Chondroitin (GLUCOSAMINE CHONDR COMPLEX PO) Take by mouth. Taking 1500-1200 mg Twice a day    [provider]  losartan-hydrochlorothiazide (HYZAAR) 100-25 MG tablet Take 1 tablet by mouth daily. 01/10/17   Isaiah Serge, NP  Magnesium 250 MG TABS Take 250  mg by mouth daily.    [provider]  meclizine (ANTIVERT) 25 MG tablet Take 25 mg by mouth 3 (three) times daily as needed (nausea).    [provider]  Menthol, Topical Analgesic, (ICY HOT BACK EX) Apply 1 application topically 3 (three) times daily as needed (pain).    [provider]  methotrexate (RHEUMATREX) 2.5 MG tablet Take 6 tablets by mouth once a week. On Thursdays 12/31/16   [provider]  Multiple Vitamins-Calcium (ONE-A-DAY WOMENS PO) Take by mouth daily.     [provider]  Omega-3 Fatty Acids (FISH OIL PO) Take 1 capsule by mouth daily.    [provider]  ondansetron (ZOFRAN ODT) 4 MG disintegrating tablet Take 1 tablet (4 mg total) by mouth every 4 (four) hours as needed for nausea or vomiting. 06/04/16   Charlesetta Shanks, MD  OTEZLA 30 MG TABS Take 30 mg by mouth 2 (two) times daily. 08/11/15   [provider]  Secukinumab (COSENTYX) 150 MG/ML SOSY Inject 300 mg into the skin every 30 (thirty) days.    [provider]  valACYclovir (VALTREX) 500 MG tablet Take 500 mg by mouth as needed (falre up).    [provider]  vitamin B-12 (CYANOCOBALAMIN) 100 MCG tablet Take 500 mcg by mouth daily.     [provider]    Family History Family History  Problem Relation Age of Onset  . Breast cancer Mother   . Hypertension Father   . Congestive Heart Failure Father   . Cardiomyopathy Father        has AICD  . Pneumonia Father   . Brain cancer Sister   . Breast cancer Sister   . Breast cancer Sister     Social History Social History   Tobacco Use  . Smoking status: Never Smoker  . Smokeless tobacco: Never Used  Substance Use Topics  . Alcohol use: No  . Drug use: No     Allergies   Aleve [naproxen]; Latex; Vicodin [hydrocodone-acetaminophen]; and Adhesive [tape]   Review of Systems Review of Systems  Constitutional: Positive for chills and fever. Negative for appetite change.   HENT: Positive for congestion, postnasal drip, rhinorrhea and sore throat. Negative for ear pain, facial swelling, hearing loss and trouble swallowing.   Eyes: Positive for photophobia. Negative for visual disturbance.  Respiratory: Positive for cough. Negative for shortness of breath and wheezing.   Cardiovascular: Negative for chest pain and leg swelling.  Gastrointestinal: Positive for nausea and vomiting. Negative for abdominal pain and diarrhea.  Genitourinary: Negative for difficulty urinating.  Musculoskeletal: Positive for myalgias (generalized). Negative for arthralgias,  neck pain and neck stiffness.  Skin: Negative for rash.  Neurological: Positive for headaches. Negative for dizziness, weakness and numbness.  Psychiatric/Behavioral: Negative for agitation.     Physical Exam Updated Vital Signs BP (!) 166/107 (BP Location: Right Arm)   Pulse 96   Temp (!) 100.5 F (38.1 C) (Oral)   Resp 16   Ht 5\' 4"  (1.626 m)   Wt 68 kg (150 lb)   SpO2 97%   BMI 25.75 kg/m   Physical Exam  Constitutional: She is oriented to person, place, and time. She appears well-developed and well-nourished. No distress.  HENT:  Head: Normocephalic and atraumatic.  Right Ear: Hearing, tympanic membrane and external ear normal.  Left Ear: Hearing, tympanic membrane and external ear normal.  Mucous membranes moist. Tonsils erythematous and swollen with white exudate.  Uvula midline.  Airway patent, able to handle oral secretions.  Postnasal drip.  Eyes: Conjunctivae and EOM are normal. Pupils are equal, round, and reactive to light. Right eye exhibits no discharge. Left eye exhibits no discharge.  Neck: Normal range of motion. Neck supple.  Cardiovascular: Normal rate, regular rhythm and intact distal pulses.  No murmur heard. Pulmonary/Chest: Effort normal and breath sounds normal. No stridor. No respiratory distress. She has no wheezes. She has no rales.  Abdominal: Soft. Bowel sounds are normal.  There is no tenderness. There is no guarding.  Musculoskeletal: Normal range of motion.  No leg swelling.  Lymphadenopathy:    She has cervical adenopathy.  Neurological: She is alert and oriented to person, place, and time. Coordination normal.  Mental Status:  Alert, oriented, thought content appropriate, able to give a coherent history. Speech fluent without evidence of aphasia. Able to follow 2 step commands without difficulty.  Cranial Nerves:  II:  Peripheral visual fields grossly normal, pupils equal, round, reactive to light III,IV, VI: ptosis not present, extra-ocular motions intact bilaterally  V,VII: smile symmetric, facial light touch sensation equal VIII: hearing grossly normal to voice  X: uvula elevates symmetrically  XI: bilateral shoulder shrug symmetric and strong XII: midline tongue extension without fassiculations Motor:  Normal tone. 5/5 in upper and lower extremities bilaterally including strong and equal grip strength and dorsiflexion/plantar flexion Sensory: Pinprick and light touch normal in all extremities.  Deep Tendon Reflexes: 2+ and symmetric in the biceps and patella Cerebellar: normal finger-to-nose with bilateral upper extremities Gait: normal gait and balance  Skin: Skin is warm and dry. Capillary refill takes less than 2 seconds. She is not diaphoretic.  Psychiatric: She has a normal mood and affect. Her behavior is normal.  Nursing note and vitals reviewed.    ED Treatments / Results  Labs (all labs ordered are listed, but only abnormal results are displayed) Labs Reviewed  RAPID STREP SCREEN (NOT AT [facility])  CULTURE, GROUP A STREP [facility])    EKG  EKG Interpretation None       Radiology No results found.  Procedures Procedures (including critical care time)  Medications Ordered in ED Medications  sodium chloride 0.9 % bolus 500 mL (500 mLs Intravenous New Bag/Given 02/04/17 1028)  ondansetron (ZOFRAN) injection 4 mg (4 mg  Intravenous Given 02/04/17 1028)  acetaminophen (TYLENOL) tablet 650 mg (650 mg Oral Given 02/04/17 1028)  diphenhydrAMINE (BENADRYL) injection 12.5 mg (12.5 mg Intravenous Given 02/04/17 1028)     Initial Impression / Assessment and Plan / ED Course  I have reviewed the triage vital signs and the nursing notes.  Pertinent labs & imaging results that  were available during my care of the patient were reviewed by me and considered in my medical decision making (see chart for details).  Clinical Course as of Feb 05 1128  Mon Feb 04, 2017  1128 On recheck patient states that her headache has improved and she is feeling better.  Is able to tolerate p.o. fluids at the bedside.  [ES]    Clinical Course User Index [ES] Glyn Ade, PA-C   Patient with symptoms consistent with influenza.  Vitals are stable, low-grade fever.  Headache improved in the emergency department.  She has no signs of dehydration and is able to tolerate p.o. fluids at the bedside.  Lungs are clear. Due to patient's presentation and physical exam a chest x-ray was not ordered bc likely diagnosis of flu.  Discussed the cost versus benefit of Tamiflu treatment with the patient.  The patient understands that symptoms are within the recommended 24-48 hour window of treatment.  Patient will be discharged with instructions to orally hydrate, rest, and use over-the-counter medications such as anti-inflammatories ibuprofen and Aleve for muscle aches and Tylenol for fever.  Patient will also be given a cough suppressant.  Counseled her to have her blood pressure rechecked as it was elevated in the emergency department today.  Instructed on return precautions and patient agrees and voiced understanding.  Final Clinical Impressions(s) / ED Diagnoses   Final diagnoses:  Flu-like symptoms    ED Discharge Orders        Ordered    oseltamivir (TAMIFLU) 75 MG capsule  Every 12 hours     02/04/17 1137    guaiFENesin-codeine  (ROBITUSSIN AC) 100-10 MG/5ML syrup  3 times daily PRN     02/04/17 1137    ondansetron (ZOFRAN ODT) 4 MG disintegrating tablet  Every 8 hours PRN     02/04/17 1137    benzonatate (TESSALON) 100 MG capsule  Every 8 hours     02/04/17 1137       Bernarda Caffey 02/04/17 1138    Virgel Manifold, MD 02/04/17 1229

## 2017-02-04 NOTE — ED Notes (Signed)
ED Provider at bedside. 

## 2017-02-04 NOTE — ED Triage Notes (Signed)
Pt reports sore throat, headache, generalized body aches and nausea since yesterday. Denies sick contacts. Took Mucinex yesterday with no relief.

## 2017-02-04 NOTE — Discharge Instructions (Signed)
I have written you a prescription for Tamiflu.  This medicine can make you nauseated, please take zofran if you feel nauseated.  I have also written you a medicine for cough.  It can make you drowsy so please do not drive, work or drink alcohol while taking it.  Please take Tylenol as needed for fever and headache.  It is important that you drink plenty of fluids to keep hydrated.  Your blood pressure was elevated in the emergency department today, please have this rechecked.  Return to the emergency department if you have vomiting that will not stop, cough which you have trouble breathing or have any new or worsening symptoms.

## 2017-02-06 LAB — CULTURE, GROUP A STREP (THRC)

## 2017-02-21 ENCOUNTER — Encounter (HOSPITAL_BASED_OUTPATIENT_CLINIC_OR_DEPARTMENT_OTHER): Payer: Self-pay

## 2017-02-21 ENCOUNTER — Other Ambulatory Visit: Payer: Self-pay

## 2017-02-21 ENCOUNTER — Emergency Department (HOSPITAL_BASED_OUTPATIENT_CLINIC_OR_DEPARTMENT_OTHER)
Admission: EM | Admit: 2017-02-21 | Discharge: 2017-02-21 | Disposition: A | Discharge status: Home / Self Care | Payer: 59 | Attending: Emergency Medicine | Admitting: Emergency Medicine

## 2017-02-21 ENCOUNTER — Emergency Department (HOSPITAL_BASED_OUTPATIENT_CLINIC_OR_DEPARTMENT_OTHER): Payer: 59

## 2017-02-21 DIAGNOSIS — E1122 Type 2 diabetes mellitus with diabetic chronic kidney disease: Secondary | ICD-10-CM | POA: Insufficient documentation

## 2017-02-21 DIAGNOSIS — J45909 Unspecified asthma, uncomplicated: Secondary | ICD-10-CM | POA: Diagnosis not present

## 2017-02-21 DIAGNOSIS — I5032 Chronic diastolic (congestive) heart failure: Secondary | ICD-10-CM | POA: Insufficient documentation

## 2017-02-21 DIAGNOSIS — Y999 Unspecified external cause status: Secondary | ICD-10-CM | POA: Diagnosis not present

## 2017-02-21 DIAGNOSIS — N182 Chronic kidney disease, stage 2 (mild): Secondary | ICD-10-CM | POA: Insufficient documentation

## 2017-02-21 DIAGNOSIS — Z79899 Other long term (current) drug therapy: Secondary | ICD-10-CM | POA: Diagnosis not present

## 2017-02-21 DIAGNOSIS — Z91040 Latex allergy status: Secondary | ICD-10-CM | POA: Diagnosis not present

## 2017-02-21 DIAGNOSIS — S92534A Nondisplaced fracture of distal phalanx of right lesser toe(s), initial encounter for closed fracture: Secondary | ICD-10-CM | POA: Diagnosis not present

## 2017-02-21 DIAGNOSIS — W228XXA Striking against or struck by other objects, initial encounter: Secondary | ICD-10-CM | POA: Insufficient documentation

## 2017-02-21 DIAGNOSIS — Y9389 Activity, other specified: Secondary | ICD-10-CM | POA: Diagnosis not present

## 2017-02-21 DIAGNOSIS — Y929 Unspecified place or not applicable: Secondary | ICD-10-CM | POA: Insufficient documentation

## 2017-02-21 DIAGNOSIS — I130 Hypertensive heart and chronic kidney disease with heart failure and stage 1 through stage 4 chronic kidney disease, or unspecified chronic kidney disease: Secondary | ICD-10-CM | POA: Diagnosis not present

## 2017-02-21 DIAGNOSIS — S99921A Unspecified injury of right foot, initial encounter: Secondary | ICD-10-CM | POA: Diagnosis present

## 2017-02-21 NOTE — Discharge Instructions (Signed)
Contact a health care provider if: You have a fever. Your pain medicine is not helping. Your toe is cold. Your toe is numb. You still have pain after one week of rest and treatment. You still have pain after your health care provider has said that you can start walking again. You have pain, tingling, or numbness in your foot that is not going away. Get help right away if: You have severe pain. You have redness or inflammation in your toe that is getting worse. You have pain or numbness in your toe that is getting worse. Your toe turns blue.

## 2017-02-21 NOTE — ED Triage Notes (Signed)
Pt injured 4th right toe-hit toe on wall approx 5pm-NAD-steady gait

## 2017-02-21 NOTE — ED Provider Notes (Signed)
[facility] EMERGENCY DEPARTMENT Provider Note   CSN: [id number] Arrival date & time: 02/21/17  1744     History   Chief Complaint Chief Complaint  Patient presents with  . Toe Injury    HPI Shannon Mason is a 52 y.o. female who presents to the ED with cc of toe injury. It occurred just prior to arrival.  Patient states that she was trying to step over 1 of her dogs toys when she kicked her foot into the wall.  She had immediate severe pain.  She is able to ambulate with pain. She denies any numbness or tingling.  HPI  Past Medical History:  Diagnosis Date  . Asthma   . Back pain   . DM (diabetes mellitus) ([place]) 2007   Gestational   . Eczema   . Fibroid    by MRI, 03-2008, s/p hysterectomy 05-2010  . Genital herpes    type 2  . Hernia, ventral 03-2008   small, by MRI   . Hip pain   . Hypertension   . Kidney disease    Dr Mercy Moore  . Neck pain   . Postpartum cardiomyopathy    with CHF, EF 15% 07-19-2008;EF 50% on 2D echo 09-2009    Patient Active Problem List   Diagnosis Date Noted  . Low back pain 06/21/2016  . Neck pain 05/24/2016  . Paresthesia 05/24/2016  . Chronic diastolic heart failure ([place]) 08/06/2013  . Essential hypertension, benign 08/06/2013  . Sickle-cell trait ([place]) 08/06/2013  . Chronic kidney disease, stage II (mild) 08/06/2013  . Ventral hernia 06/09/2013    Past Surgical History:  Procedure Laterality Date  . ABDOMINAL HYSTERECTOMY  05-2010  . HERNIA REPAIR     2010  . OTHER SURGICAL HISTORY     Spleen repair  . RENAL BIOPSY     Benign  . TUBAL LIGATION    . US ECHOCARDIOGRAPHY     2D Echo EF 50% 09-2009    OB History    No data available       Home Medications    Prior to Admission medications   Medication Sig Start Date End Date Taking? Authorizing Provider  acetaminophen (TYLENOL) 500 MG tablet Take 1,000 mg by mouth every 6 (six) hours as needed for pain (pain).    [provider]    acetaminophen-codeine (TYLENOL #3) 300-30 MG tablet Take 1 tablet by mouth every 6 (six) hours as needed for pain. 08/25/15   [provider]  benzonatate (TESSALON) 100 MG capsule Take 1 capsule (100 mg total) by mouth every 8 (eight) hours. 02/04/17   Glyn Ade, PA-C  Calcium Carbonate-Vitamin D (CALCIUM 500 + D) 500-125 MG-UNIT TABS Take 1 tablet by mouth daily.    [provider]  carvedilol (COREG) 6.25 MG tablet Take 0.5 tablets (3.125 mg total) by mouth 2 (two) times daily. 01/10/17   Isaiah Serge, NP  cetirizine (ZYRTEC) 10 MG tablet Take 10 mg by mouth daily.     [provider]  Chromium 500 MCG TABS Take 500 mcg by mouth daily.    [provider]  DULoxetine (CYMBALTA) 60 MG capsule Take 1 capsule (60 mg total) by mouth daily. 07/16/16   Marcial Pacas, MD  ferrous fumarate (HEMOCYTE - 106 MG FE) 325 (106 FE) MG TABS Take 1 tablet by mouth daily.    [provider]  folic acid (FOLVITE) 789 MCG tablet Take 400 mcg by mouth daily.  [provider]  Glucosamine-Chondroitin (GLUCOSAMINE CHONDR COMPLEX PO) Take by mouth. Taking 1500-1200 mg Twice a day    [provider]  guaiFENesin-codeine (ROBITUSSIN AC) 100-10 MG/5ML syrup Take 5 mLs by mouth 3 (three) times daily as needed for cough. 02/04/17   Glyn Ade, PA-C  losartan-hydrochlorothiazide (HYZAAR) 100-25 MG tablet Take 1 tablet by mouth daily. 01/10/17   Isaiah Serge, NP  Magnesium 250 MG TABS Take 250 mg by mouth daily.    [provider]  meclizine (ANTIVERT) 25 MG tablet Take 25 mg by mouth 3 (three) times daily as needed (nausea).    [provider]  Menthol, Topical Analgesic, (ICY HOT BACK EX) Apply 1 application topically 3 (three) times daily as needed (pain).    [provider]  methotrexate (RHEUMATREX) 2.5 MG tablet Take 6 tablets by mouth once a week. On Thursdays 12/31/16   [provider]  Multiple  Vitamins-Calcium (ONE-A-DAY WOMENS PO) Take by mouth daily.     [provider]  Omega-3 Fatty Acids (FISH OIL PO) Take 1 capsule by mouth daily.    [provider]  ondansetron (ZOFRAN ODT) 4 MG disintegrating tablet Take 1 tablet (4 mg total) by mouth every 4 (four) hours as needed for nausea or vomiting. 06/04/16   Charlesetta Shanks, MD  ondansetron (ZOFRAN ODT) 4 MG disintegrating tablet Take 1 tablet (4 mg total) by mouth every 8 (eight) hours as needed for nausea or vomiting. 02/04/17   Glyn Ade, PA-C  oseltamivir (TAMIFLU) 75 MG capsule Take 1 capsule (75 mg total) by mouth every 12 (twelve) hours. 02/04/17   Glyn Ade, PA-C  OTEZLA 30 MG TABS Take 30 mg by mouth 2 (two) times daily. 08/11/15   [provider]  Secukinumab (COSENTYX) 150 MG/ML SOSY Inject 300 mg into the skin every 30 (thirty) days.    [provider]  valACYclovir (VALTREX) 500 MG tablet Take 500 mg by mouth as needed (falre up).    [provider]  vitamin B-12 (CYANOCOBALAMIN) 100 MCG tablet Take 500 mcg by mouth daily.     [provider]    Family History Family History  Problem Relation Age of Onset  . Breast cancer Mother   . Hypertension Father   . Congestive Heart Failure Father   . Cardiomyopathy Father        has AICD  . Pneumonia Father   . Brain cancer Sister   . Breast cancer Sister   . Breast cancer Sister     Social History Social History   Tobacco Use  . Smoking status: Never Smoker  . Smokeless tobacco: Never Used  Substance Use Topics  . Alcohol use: No  . Drug use: No     Allergies   Aleve [naproxen]; Ibuprofen; Latex; Vicodin [hydrocodone-acetaminophen]; and Adhesive [tape]   Review of Systems Review of Systems  Ten systems reviewed and are negative for acute change, except as noted in the HPI.   Physical Exam Updated Vital Signs BP (!) 140/96 (BP Location: Left Arm)   Pulse 96   Temp 98.6 F (37 C)  (Oral)   Resp 16   Ht 5\' 4"  (1.626 m)   Wt 67.1 kg (148 lb)   SpO2 100%   BMI 25.40 kg/m   Physical Exam  Constitutional: She is oriented to person, place, and time. She appears well-developed and well-nourished. No distress.  HENT:  Head: Normocephalic and atraumatic.  Eyes: Conjunctivae are normal. No  scleral icterus.  Neck: Normal range of motion.  Cardiovascular: Normal rate, regular rhythm and normal heart sounds. Exam reveals no gallop and no friction rub.  No murmur heard. Pulmonary/Chest: Effort normal and breath sounds normal. No respiratory distress.  Abdominal: Soft. Bowel sounds are normal. She exhibits no distension and no mass. There is no tenderness. There is no guarding.  Musculoskeletal:  Swollen, tender fourth digit of the right foot, no deformity, no wounds  Neurological: She is alert and oriented to person, place, and time.  Skin: Skin is warm and dry. She is not diaphoretic.  Psychiatric: Her behavior is normal.  Nursing note and vitals reviewed.    ED Treatments / Results  Labs (all labs ordered are listed, but only abnormal results are displayed) Labs Reviewed - No data to display  EKG  EKG Interpretation None       Radiology Dg Toe 4th Right  Result Date: 02/21/2017 CLINICAL DATA:  Stubbed fourth toe EXAM: RIGHT FOURTH TOE COMPARISON:  03/25/2012 FINDINGS: Acute, mildly displaced fracture involving the mid to distal shaft of the fourth proximal phalanx. Soft tissue swelling is present. No subluxation. Questionable tiny fracture involving the base of the fourth distal phalanx. IMPRESSION: 1. Acute mildly displaced fracture involving mid to distal shaft of fourth proximal phalanx 2. Possible tiny fracture involving the base of the fourth distal phalanx. Electronically Signed   By: Donavan Foil M.D.   On: 02/21/2017 19:23    Procedures Procedures (including critical care time)  Medications Ordered in ED Medications - No data to  display   Initial Impression / Assessment and Plan / ED Course  I have reviewed the triage vital signs and the nursing notes.  Pertinent labs & imaging results that were available during my care of the patient were reviewed by me and considered in my medical decision making (see chart for details).     Patient with closed toe fracture.  She will be discharged with postop shoe and crutches.  Patient appears otherwise stable and appropriate for discharge at this time.  Final Clinical Impressions(s) / ED Diagnoses   Final diagnoses:  Closed nondisplaced fracture of distal phalanx of lesser toe of right foot, initial encounter    ED Discharge Orders    None       Margarita Mail, PA-C 02/22/17 0030    Tegeler, Gwenyth Allegra, MD 02/22/17 [phone]

## 2017-02-21 NOTE — ED Notes (Signed)
PMS intact before and after. Pt tolerated well. All questions answered. 

## 2017-02-26 ENCOUNTER — Ambulatory Visit (INDEPENDENT_AMBULATORY_CARE_PROVIDER_SITE_OTHER): Payer: 59 | Admitting: Orthopaedic Surgery

## 2017-02-26 ENCOUNTER — Encounter (INDEPENDENT_AMBULATORY_CARE_PROVIDER_SITE_OTHER): Payer: Self-pay

## 2017-02-26 DIAGNOSIS — S92501A Displaced unspecified fracture of right lesser toe(s), initial encounter for closed fracture: Secondary | ICD-10-CM

## 2017-02-26 NOTE — Progress Notes (Signed)
Office Visit Note   Patient: Shannon Mason           Date of Birth: 11-10-1965           MRN: [id number] Visit Date: 02/26/2017              Requested by: Jonathon Jordan, MD [address] [place], [place] [address] PCP: Jonathon Jordan, MD   Assessment & Plan: Visit Diagnoses:  1. Closed fracture of phalanx of right fourth toe, initial encounter     Plan: Impression is right fourth toe avulsion fracture.  Recommend buddy taped to the third toe.  Postop shoe and wean as tolerated.  May return back to work tomorrow.  Follow-up as needed.  Discussed with the patient normal expected healing time of the toe fracture.  Follow-Up Instructions: Return if symptoms worsen or fail to improve.   Orders:  No orders of the defined types were placed in this encounter.  No orders of the defined types were placed in this encounter.     Procedures: No procedures performed   Clinical Data: No additional findings.   Subjective: No chief complaint on file.   Patient comes in today for toe injury.  Her right fourth toe was injured when she stepped on a dog toy.  She denies any numbness and tingling.  The pain is overall improving.  She is taking Tylenol.    Review of Systems  Constitutional: Negative.   HENT: Negative.   Eyes: Negative.   Respiratory: Negative.   Cardiovascular: Negative.   Endocrine: Negative.   Musculoskeletal: Negative.   Neurological: Negative.   Hematological: Negative.   Psychiatric/Behavioral: Negative.   All other systems reviewed and are negative.    Objective: Vital Signs: There were no vitals taken for this visit.  Physical Exam  Constitutional: She is oriented to person, place, and time. She appears well-developed and well-nourished.  HENT:  Head: Normocephalic and atraumatic.  Eyes: EOM are normal.  Neck: Neck supple.  Pulmonary/Chest: Effort normal.  Abdominal: Soft.  Neurological: She is alert and oriented to  person, place, and time.  Skin: Skin is warm. Capillary refill takes less than 2 seconds.  Psychiatric: She has a normal mood and affect. Her behavior is normal. Judgment and thought content normal.  Nursing note and vitals reviewed.   Ortho Exam Right foot exam shows tenderness of the fourth toe to palpation.  No significant swelling or bruising.  No neurovascular compromise. Specialty Comments:  No specialty comments available.  Imaging: No results found.   PMFS History: Patient Active Problem List   Diagnosis Date Noted  . Closed fracture of phalanx of right fourth toe 02/26/2017  . Low back pain 06/21/2016  . Neck pain 05/24/2016  . Paresthesia 05/24/2016  . Chronic diastolic heart failure ([place]) 08/06/2013  . Essential hypertension, benign 08/06/2013  . Sickle-cell trait ([place]) 08/06/2013  . Chronic kidney disease, stage II (mild) 08/06/2013  . Ventral hernia 06/09/2013   Past Medical History:  Diagnosis Date  . Asthma   . Back pain   . DM (diabetes mellitus) ([place]) 2007   Gestational   . Eczema   . Fibroid    by MRI, 03-2008, s/p hysterectomy 05-2010  . Genital herpes    type 2  . Hernia, ventral 03-2008   small, by MRI   . Hip pain   . Hypertension   . Kidney disease    Dr Mercy Moore  . Neck pain   . Postpartum  cardiomyopathy    with CHF, EF 15% 07-19-2008;EF 50% on 2D echo 09-2009    Family History  Problem Relation Age of Onset  . Breast cancer Mother   . Hypertension Father   . Congestive Heart Failure Father   . Cardiomyopathy Father        has AICD  . Pneumonia Father   . Brain cancer Sister   . Breast cancer Sister   . Breast cancer Sister     Past Surgical History:  Procedure Laterality Date  . ABDOMINAL HYSTERECTOMY  05-2010  . HERNIA REPAIR     2010  . OTHER SURGICAL HISTORY     Spleen repair  . RENAL BIOPSY     Benign  . TUBAL LIGATION    . US ECHOCARDIOGRAPHY     2D Echo EF 50% 09-2009   Social History   Occupational History  .  Occupation: Garment/textile technologist  Tobacco Use  . Smoking status: Never Smoker  . Smokeless tobacco: Never Used  Substance and Sexual Activity  . Alcohol use: No  . Drug use: No  . Sexual activity: Not on file

## 2017-03-05 ENCOUNTER — Ambulatory Visit (INDEPENDENT_AMBULATORY_CARE_PROVIDER_SITE_OTHER): Payer: 59 | Admitting: Orthopaedic Surgery

## 2017-03-05 ENCOUNTER — Telehealth (INDEPENDENT_AMBULATORY_CARE_PROVIDER_SITE_OTHER): Payer: Self-pay | Admitting: Radiology

## 2017-03-05 ENCOUNTER — Encounter (INDEPENDENT_AMBULATORY_CARE_PROVIDER_SITE_OTHER): Payer: Self-pay | Admitting: Orthopaedic Surgery

## 2017-03-05 DIAGNOSIS — S92501A Displaced unspecified fracture of right lesser toe(s), initial encounter for closed fracture: Secondary | ICD-10-CM

## 2017-03-05 NOTE — Telephone Encounter (Signed)
Patient calling triage line, complaining of pain right foot with increased redness and pain plantar aspect of foot. She complains of fever and nausea. She questions if she has some type of infection. She denies open wound to this area. Positive for phalanx fracture and seen by Dr. Erlinda Hong last week. Advised patient to come in the office this afternoon at 245pm to repeat evaluation to r/o infection or charcot arthropathy/ gout.

## 2017-03-05 NOTE — Progress Notes (Signed)
Office Visit Note   Patient: Shannon Mason           Date of Birth: 1965/10/31           MRN: [id number] Visit Date: 03/05/2017              Requested by: Jonathon Jordan, MD [address] [place], [place] [address] PCP: Jonathon Jordan, MD   Assessment & Plan: Visit Diagnoses:  1. Closed fracture of phalanx of right fourth toe, initial encounter     Plan: Impression is right foot swelling.  Reassurance was given normal to see this after fracture.  Index of suspicion for DVT is very low.  But I did option of getting a Doppler just to rule this out.  Patient declined.  Follow-up as needed.  Follow-Up Instructions: Return if symptoms worsen or fail to improve.   Orders:  No orders of the defined types were placed in this encounter.  No orders of the defined types were placed in this encounter.     Procedures: No procedures performed   Clinical Data: No additional findings.   Subjective: Chief Complaint  Patient presents with  . Right Foot - Pain    Patient comes in today for concern of foot swelling.  She denies any shortness of breath or chest pain.  This is much better.  She was having some throbbing pain in the forefoot and some radiation into the lateral calf.    Review of Systems   Objective: Vital Signs: There were no vitals taken for this visit.  Physical Exam  Ortho Exam Right foot exam is looking much better today.  Patient will swelling.  No evidence of infection.  Calf is nontender. Specialty Comments:  No specialty comments available.  Imaging: No results found.   PMFS History: Patient Active Problem List   Diagnosis Date Noted  . Closed fracture of phalanx of right fourth toe 02/26/2017  . Low back pain 06/21/2016  . Neck pain 05/24/2016  . Paresthesia 05/24/2016  . Chronic diastolic heart failure ([place]) 08/06/2013  . Essential hypertension, benign 08/06/2013  . Sickle-cell trait ([place]) 08/06/2013  . Chronic  kidney disease, stage II (mild) 08/06/2013  . Ventral hernia 06/09/2013   Past Medical History:  Diagnosis Date  . Asthma   . Back pain   . DM (diabetes mellitus) ([place]) 2007   Gestational   . Eczema   . Fibroid    by MRI, 03-2008, s/p hysterectomy 05-2010  . Genital herpes    type 2  . Hernia, ventral 03-2008   small, by MRI   . Hip pain   . Hypertension   . Kidney disease    Dr Mercy Moore  . Neck pain   . Postpartum cardiomyopathy    with CHF, EF 15% 07-19-2008;EF 50% on 2D echo 09-2009    Family History  Problem Relation Age of Onset  . Breast cancer Mother   . Hypertension Father   . Congestive Heart Failure Father   . Cardiomyopathy Father        has AICD  . Pneumonia Father   . Brain cancer Sister   . Breast cancer Sister   . Breast cancer Sister     Past Surgical History:  Procedure Laterality Date  . ABDOMINAL HYSTERECTOMY  05-2010  . HERNIA REPAIR     2010  . OTHER SURGICAL HISTORY     Spleen repair  . RENAL BIOPSY     Benign  . TUBAL  LIGATION    . US ECHOCARDIOGRAPHY     2D Echo EF 50% 09-2009   Social History   Occupational History  . Occupation: Garment/textile technologist  Tobacco Use  . Smoking status: Never Smoker  . Smokeless tobacco: Never Used  Substance and Sexual Activity  . Alcohol use: No  . Drug use: No  . Sexual activity: Not on file

## 2018-11-26 ENCOUNTER — Ambulatory Visit
Admission: RE | Admit: 2018-11-26 | Discharge: 2018-11-26 | Disposition: A | Discharge status: Home / Self Care | Payer: 59 | Source: Ambulatory Visit | Attending: Family Medicine | Admitting: Family Medicine

## 2018-11-26 ENCOUNTER — Other Ambulatory Visit: Payer: Self-pay | Admitting: Family Medicine

## 2018-11-26 DIAGNOSIS — M79631 Pain in right forearm: Secondary | ICD-10-CM

## 2018-12-23 ENCOUNTER — Other Ambulatory Visit: Payer: Self-pay | Admitting: Cardiology

## 2018-12-23 DIAGNOSIS — I10 Essential (primary) hypertension: Secondary | ICD-10-CM

## 2018-12-23 DIAGNOSIS — I5032 Chronic diastolic (congestive) heart failure: Secondary | ICD-10-CM

## 2019-01-12 ENCOUNTER — Other Ambulatory Visit: Payer: Self-pay | Admitting: Interventional Cardiology

## 2019-01-12 DIAGNOSIS — I5032 Chronic diastolic (congestive) heart failure: Secondary | ICD-10-CM

## 2019-01-12 DIAGNOSIS — I10 Essential (primary) hypertension: Secondary | ICD-10-CM

## 2019-03-04 ENCOUNTER — Telehealth (HOSPITAL_COMMUNITY): Payer: Self-pay | Admitting: Nurse Practitioner

## 2019-03-04 ENCOUNTER — Other Ambulatory Visit (HOSPITAL_COMMUNITY): Payer: Self-pay | Admitting: Nurse Practitioner

## 2019-03-04 DIAGNOSIS — U071 COVID-19: Secondary | ICD-10-CM

## 2019-03-04 NOTE — Progress Notes (Signed)
  I connected by phone with Shannon Mason on 03/04/2019 at 5:51 PM to discuss the potential use of an new treatment for mild to moderate COVID-19 viral infection in non-hospitalized patients.  This patient is a 54 y.o. female that meets the FDA criteria for Emergency Use Authorization of bamlanivimab or casirivimab\imdevimab.  Has a (+) direct SARS-CoV-2 viral test result  Has mild or moderate COVID-19   Is ? 55 years of age and weighs ? 40 kg  Is NOT hospitalized due to COVID-19  Is NOT requiring oxygen therapy or requiring an increase in baseline oxygen flow rate due to COVID-19  Is within 10 days of symptom onset  Has at least one of the high risk factor(s) for progression to severe COVID-19 and/or hospitalization as defined in EUA.  Specific high risk criteria : Currently receiving immunosuppressive treatment (on methotrexate), CKD (stable on medication), psoriatic arthritis   I have spoken and communicated the following to the patient or parent/caregiver:  1. FDA has authorized the emergency use of bamlanivimab and casirivimab\imdevimab for the treatment of mild to moderate COVID-19 in adults and pediatric patients with positive results of direct SARS-CoV-2 viral testing who are 88 years of age and older weighing at least 40 kg, and who are at high risk for progressing to severe COVID-19 and/or hospitalization.  2. The significant known and potential risks and benefits of bamlanivimab and casirivimab\imdevimab, and the extent to which such potential risks and benefits are unknown.  3. Information on available alternative treatments and the risks and benefits of those alternatives, including clinical trials.  4. Patients treated with bamlanivimab and casirivimab\imdevimab should continue to self-isolate and use infection control measures (e.g., wear mask, isolate, social distance, avoid sharing personal items, clean and disinfect "high touch" surfaces, and frequent handwashing)  according to CDC guidelines.   5. The patient or parent/caregiver has the option to accept or refuse bamlanivimab or casirivimab\imdevimab .  After reviewing this information with the patient, The patient agreed to proceed with receiving the bamlanimivab infusion and will be provided a copy of the Fact sheet prior to receiving the infusion.[name] 03/04/2019 5:51 PM

## 2019-03-04 NOTE — Telephone Encounter (Signed)
Called patient and discussed covid positive results. She became symptomatic on 1/24. She has a history of CKD, currently stable on medication, hx of psoriatic arthritis on methotrexate. We discussed MAB treatment. See orders note.

## 2019-03-08 ENCOUNTER — Ambulatory Visit (HOSPITAL_COMMUNITY)
Admission: RE | Admit: 2019-03-08 | Discharge: 2019-03-08 | Disposition: A | Discharge status: Home / Self Care | Payer: 59 | Source: Ambulatory Visit | Attending: Pulmonary Disease | Admitting: Pulmonary Disease

## 2019-03-08 ENCOUNTER — Encounter (HOSPITAL_COMMUNITY): Payer: Self-pay

## 2019-03-08 DIAGNOSIS — U071 COVID-19: Secondary | ICD-10-CM

## 2019-03-08 DIAGNOSIS — Z23 Encounter for immunization: Secondary | ICD-10-CM | POA: Diagnosis not present

## 2019-03-08 MED ORDER — SODIUM CHLORIDE 0.9 % IV SOLN
INTRAVENOUS | Status: DC | PRN
  Administered 2019-03-08: 09:00:00 250 mL via INTRAVENOUS

## 2019-03-08 MED ORDER — SODIUM CHLORIDE 0.9 % IV SOLN
700.0000 mg | Freq: Once | INTRAVENOUS | Status: AC
  Administered 2019-03-08: 09:00:00 700 mg via INTRAVENOUS
  Filled 2019-03-08: qty 20

## 2019-03-08 MED ORDER — FAMOTIDINE IN NACL 20-0.9 MG/50ML-% IV SOLN: 20.0000 mg | Freq: Once | INTRAVENOUS | Status: DC | PRN

## 2019-03-08 MED ORDER — ALBUTEROL SULFATE HFA 108 (90 BASE) MCG/ACT IN AERS: 2.0000 | INHALATION_SPRAY | Freq: Once | RESPIRATORY_TRACT | Status: DC | PRN

## 2019-03-08 MED ORDER — EPINEPHRINE 0.3 MG/0.3ML IJ SOAJ: 0.3000 mg | Freq: Once | INTRAMUSCULAR | Status: DC | PRN

## 2019-03-08 MED ORDER — DIPHENHYDRAMINE HCL 50 MG/ML IJ SOLN: 50.0000 mg | Freq: Once | INTRAMUSCULAR | Status: DC | PRN

## 2019-03-08 MED ORDER — METHYLPREDNISOLONE SODIUM SUCC 125 MG IJ SOLR: 125.0000 mg | Freq: Once | INTRAMUSCULAR | Status: DC | PRN

## 2019-03-08 NOTE — Discharge Instructions (Signed)

## 2019-03-08 NOTE — Progress Notes (Signed)
  Diagnosis: COVID-19  Physician:Dr Joya Gaskins  Procedure: [facility] Med: bamlanivimab infusion - Provided patient with bamlanimivab fact sheet for patients, parents and caregivers prior to infusion.  Complications: No immediate complications noted.  Discharge: Discharged home   Dewaine Oats 03/08/2019

## 2019-04-01 ENCOUNTER — Ambulatory Visit
Admission: RE | Admit: 2019-04-01 | Discharge: 2019-04-01 | Disposition: A | Discharge status: Home / Self Care | Payer: 59 | Source: Ambulatory Visit | Attending: Family Medicine | Admitting: Family Medicine

## 2019-04-01 ENCOUNTER — Other Ambulatory Visit: Payer: Self-pay | Admitting: Family Medicine

## 2019-04-01 DIAGNOSIS — R05 Cough: Secondary | ICD-10-CM

## 2019-04-01 DIAGNOSIS — R059 Cough, unspecified: Secondary | ICD-10-CM

## 2019-08-02 ENCOUNTER — Other Ambulatory Visit: Payer: Self-pay

## 2019-08-02 ENCOUNTER — Encounter (HOSPITAL_BASED_OUTPATIENT_CLINIC_OR_DEPARTMENT_OTHER): Payer: Self-pay | Admitting: Emergency Medicine

## 2019-08-02 ENCOUNTER — Emergency Department (HOSPITAL_BASED_OUTPATIENT_CLINIC_OR_DEPARTMENT_OTHER)
Admission: EM | Admit: 2019-08-02 | Discharge: 2019-08-03 | Disposition: A | Discharge status: Home / Self Care | Payer: 59 | Attending: Emergency Medicine | Admitting: Emergency Medicine

## 2019-08-02 DIAGNOSIS — I5032 Chronic diastolic (congestive) heart failure: Secondary | ICD-10-CM | POA: Diagnosis not present

## 2019-08-02 DIAGNOSIS — E119 Type 2 diabetes mellitus without complications: Secondary | ICD-10-CM | POA: Diagnosis not present

## 2019-08-02 DIAGNOSIS — R197 Diarrhea, unspecified: Secondary | ICD-10-CM | POA: Diagnosis not present

## 2019-08-02 DIAGNOSIS — N182 Chronic kidney disease, stage 2 (mild): Secondary | ICD-10-CM | POA: Diagnosis not present

## 2019-08-02 DIAGNOSIS — R6883 Chills (without fever): Secondary | ICD-10-CM | POA: Diagnosis not present

## 2019-08-02 DIAGNOSIS — Z79899 Other long term (current) drug therapy: Secondary | ICD-10-CM | POA: Insufficient documentation

## 2019-08-02 DIAGNOSIS — I130 Hypertensive heart and chronic kidney disease with heart failure and stage 1 through stage 4 chronic kidney disease, or unspecified chronic kidney disease: Secondary | ICD-10-CM | POA: Insufficient documentation

## 2019-08-02 DIAGNOSIS — B349 Viral infection, unspecified: Secondary | ICD-10-CM | POA: Diagnosis not present

## 2019-08-02 DIAGNOSIS — R112 Nausea with vomiting, unspecified: Secondary | ICD-10-CM | POA: Diagnosis present

## 2019-08-02 DIAGNOSIS — Z7984 Long term (current) use of oral hypoglycemic drugs: Secondary | ICD-10-CM | POA: Insufficient documentation

## 2019-08-02 DIAGNOSIS — J45909 Unspecified asthma, uncomplicated: Secondary | ICD-10-CM | POA: Diagnosis not present

## 2019-08-02 DIAGNOSIS — Z91040 Latex allergy status: Secondary | ICD-10-CM | POA: Diagnosis not present

## 2019-08-02 DIAGNOSIS — R519 Headache, unspecified: Secondary | ICD-10-CM | POA: Diagnosis not present

## 2019-08-02 LAB — URINALYSIS, ROUTINE W REFLEX MICROSCOPIC
Bilirubin Urine: NEGATIVE
Glucose, UA: NEGATIVE mg/dL
Hgb urine dipstick: NEGATIVE
Ketones, ur: NEGATIVE mg/dL
Nitrite: NEGATIVE
Protein, ur: NEGATIVE mg/dL
Specific Gravity, Urine: 1.02 (ref 1.005–1.030)
pH: 6.5 (ref 5.0–8.0)

## 2019-08-02 LAB — URINALYSIS, MICROSCOPIC (REFLEX): Squamous Epithelial / LPF: NONE SEEN (ref 0–5)

## 2019-08-02 MED ORDER — SODIUM CHLORIDE 0.9 % IV BOLUS
500.0000 mL | Freq: Once | INTRAVENOUS | Status: AC
  Administered 2019-08-03: 500 mL via INTRAVENOUS

## 2019-08-02 MED ORDER — PROCHLORPERAZINE EDISYLATE 10 MG/2ML IJ SOLN
10.0000 mg | Freq: Once | INTRAMUSCULAR | Status: AC
  Administered 2019-08-03: 10 mg via INTRAVENOUS
  Filled 2019-08-02: qty 2

## 2019-08-02 MED ORDER — DIPHENHYDRAMINE HCL 50 MG/ML IJ SOLN
12.5000 mg | Freq: Once | INTRAMUSCULAR | Status: DC
  Filled 2019-08-02: qty 1

## 2019-08-02 NOTE — ED Triage Notes (Signed)
Pt reports N/V/D since 2300 last night. Also reports headaches.

## 2019-08-03 LAB — COMPREHENSIVE METABOLIC PANEL
ALT: 25 U/L (ref 0–44)
AST: 24 U/L (ref 15–41)
Albumin: 4 g/dL (ref 3.5–5.0)
Alkaline Phosphatase: 67 U/L (ref 38–126)
Anion gap: 12 (ref 5–15)
BUN: 11 mg/dL (ref 6–20)
CO2: 28 mmol/L (ref 22–32)
Calcium: 9.6 mg/dL (ref 8.9–10.3)
Chloride: 97 mmol/L — ABNORMAL LOW (ref 98–111)
Creatinine, Ser: 0.66 mg/dL (ref 0.44–1.00)
GFR calc Af Amer: >60 mL/min (ref >60)
GFR calc non Af Amer: >60 mL/min (ref >60)
Glucose, Bld: 102 mg/dL — ABNORMAL HIGH (ref 70–99)
Potassium: 3.7 mmol/L (ref 3.5–5.1)
Sodium: 137 mmol/L (ref 135–145)
Total Bilirubin: 0.7 mg/dL (ref 0.3–1.2)
Total Protein: 7.1 g/dL (ref 6.5–8.1)

## 2019-08-03 LAB — CBC WITH DIFFERENTIAL/PLATELET
Abs Immature Granulocytes: 0.03 10*3/uL (ref 0.00–0.07)
Basophils Absolute: 0.1 10*3/uL (ref 0.0–0.1)
Basophils Relative: 1 %
Eosinophils Absolute: 0.1 10*3/uL (ref 0.0–0.5)
Eosinophils Relative: 2 %
HCT: 38.4 % (ref 36.0–46.0)
Hemoglobin: 12.3 g/dL (ref 12.0–15.0)
Immature Granulocytes: 0 %
Lymphocytes Relative: 44 %
Lymphs Abs: 3.3 10*3/uL (ref 0.7–4.0)
MCH: 24.8 pg — ABNORMAL LOW (ref 26.0–34.0)
MCHC: 32 g/dL (ref 30.0–36.0)
MCV: 77.6 fL — ABNORMAL LOW (ref 80.0–100.0)
Monocytes Absolute: 0.5 10*3/uL (ref 0.1–1.0)
Monocytes Relative: 7 %
Neutro Abs: 3.4 10*3/uL (ref 1.7–7.7)
Neutrophils Relative %: 46 %
Platelets: 300 10*3/uL (ref 150–400)
RBC: 4.95 MIL/uL (ref 3.87–5.11)
RDW: 17.1 % — ABNORMAL HIGH (ref 11.5–15.5)
WBC: 7.4 10*3/uL (ref 4.0–10.5)
nRBC: 0 % (ref 0.0–0.2)

## 2019-08-03 MED ORDER — ONDANSETRON 4 MG PO TBDP: 4.0000 mg | ORAL_TABLET | Freq: Three times a day (TID) | ORAL | 0 refills | Status: AC | PRN

## 2019-08-03 NOTE — ED Provider Notes (Signed)
[facility] Provider Note   CSN: [id number] Arrival date & time: 08/02/19  1919     History Chief Complaint  Patient presents with  . Emesis  . Headache    Shannon Mason is a 54 y.o. female.  HPI     This is a 54 year old female with a history of diabetes, hypertension, postpartum cardiomyopathy with recovered EF who presents with nausea, vomiting, diarrhea, and headache.  Patient reports onset of symptoms approximately 24 hours prior to arrival.  She reports multiple episodes of nonbilious, nonbloody emesis and persistent nausea.  She also reports nonbloody diarrhea.  She states that since that time she has developed a right-sided headache that is nonradiating.  She has taken some over-the-counter medications without relief.  Denies neck stiffness.  She reports chills without documented fevers.  No recent sick contacts.  She is fully vaccinated against COVID-19.  She also had COVID-19 infection in January of this past year.  Past Medical History:  Diagnosis Date  . Asthma   . Back pain   . DM (diabetes mellitus) ([place]) 2007   Gestational   . Eczema   . Fibroid    by MRI, 03-2008, s/p hysterectomy 05-2010  . Genital herpes    type 2  . Hernia, ventral 03-2008   small, by MRI   . Hip pain   . Hypertension   . Kidney disease    Dr Mercy Moore  . Neck pain   . Postpartum cardiomyopathy    with CHF, EF 15% 07-19-2008;EF 50% on 2D echo 09-2009    Patient Active Problem List   Diagnosis Date Noted  . Closed fracture of phalanx of right fourth toe 02/26/2017  . Low back pain 06/21/2016  . Neck pain 05/24/2016  . Paresthesia 05/24/2016  . Chronic diastolic heart failure ([place]) 08/06/2013  . Essential hypertension, benign 08/06/2013  . Sickle-cell trait ([place]) 08/06/2013  . Chronic kidney disease, stage II (mild) 08/06/2013  . Ventral hernia 06/09/2013    Past Surgical History:  Procedure Laterality Date  . ABDOMINAL HYSTERECTOMY  05-2010    . HERNIA REPAIR     2010  . OTHER SURGICAL HISTORY     Spleen repair  . RENAL BIOPSY     Benign  . TUBAL LIGATION    . US ECHOCARDIOGRAPHY     2D Echo EF 50% 09-2009     OB History   No obstetric history on file.     Family History  Problem Relation Age of Onset  . Breast cancer Mother   . Hypertension Father   . Congestive Heart Failure Father   . Cardiomyopathy Father        has AICD  . Pneumonia Father   . Brain cancer Sister   . Breast cancer Sister   . Breast cancer Sister     Social History   Tobacco Use  . Smoking status: Never Smoker  . Smokeless tobacco: Never Used  Substance Use Topics  . Alcohol use: No  . Drug use: No    Home Medications Prior to Admission medications   Medication Sig Start Date End Date Taking? Authorizing Provider  acetaminophen (TYLENOL) 500 MG tablet Take 1,000 mg by mouth every 6 (six) hours as needed for pain (pain).    [provider]  acetaminophen-codeine (TYLENOL #3) 300-30 MG tablet Take 1 tablet by mouth every 6 (six) hours as needed for pain. 08/25/15   [provider]  benzonatate (TESSALON) 100 MG capsule Take  1 capsule (100 mg total) by mouth every 8 (eight) hours. 02/04/17   Glyn Ade, PA-C  Calcium Carbonate-Vitamin D (CALCIUM 500 + D) 500-125 MG-UNIT TABS Take 1 tablet by mouth daily.    [provider]  carvedilol (COREG) 6.25 MG tablet Take 0.5 tablets (3.125 mg total) by mouth 2 (two) times daily with a meal. NEED OV. 12/25/18   Isaiah Serge, NP  cetirizine (ZYRTEC) 10 MG tablet Take 10 mg by mouth daily.     [provider]  Chromium 500 MCG TABS Take 500 mcg by mouth daily.    [provider]  DULoxetine (CYMBALTA) 60 MG capsule Take 1 capsule (60 mg total) by mouth daily. 07/16/16   Marcial Pacas, MD  ferrous fumarate (HEMOCYTE - 106 MG FE) 325 (106 FE) MG TABS Take 1 tablet by mouth daily.    [provider]  folic acid (FOLVITE) 841 MCG tablet Take  400 mcg by mouth daily.    [provider]  Glucosamine-Chondroitin (GLUCOSAMINE CHONDR COMPLEX PO) Take by mouth. Taking 1500-1200 mg Twice a day    [provider]  guaiFENesin-codeine (ROBITUSSIN AC) 100-10 MG/5ML syrup Take 5 mLs by mouth 3 (three) times daily as needed for cough. 02/04/17   Glyn Ade, PA-C  losartan-hydrochlorothiazide (HYZAAR) 100-25 MG tablet Take 1 tablet by mouth daily. 01/10/17   Isaiah Serge, NP  Magnesium 250 MG TABS Take 250 mg by mouth daily.    [provider]  meclizine (ANTIVERT) 25 MG tablet Take 25 mg by mouth 3 (three) times daily as needed (nausea).    [provider]  Menthol, Topical Analgesic, (ICY HOT BACK EX) Apply 1 application topically 3 (three) times daily as needed (pain).    [provider]  methotrexate (RHEUMATREX) 2.5 MG tablet Take 6 tablets by mouth once a week. On Thursdays 12/31/16   [provider]  Multiple Vitamins-Calcium (ONE-A-DAY WOMENS PO) Take by mouth daily.     [provider]  Omega-3 Fatty Acids (FISH OIL PO) Take 1 capsule by mouth daily.    [provider]  ondansetron (ZOFRAN ODT) 4 MG disintegrating tablet Take 1 tablet (4 mg total) by mouth every 8 (eight) hours as needed. 08/03/19   Joaquin Knebel, Barbette Hair, MD  oseltamivir (TAMIFLU) 75 MG capsule Take 1 capsule (75 mg total) by mouth every 12 (twelve) hours. 02/04/17   Glyn Ade, PA-C  OTEZLA 30 MG TABS Take 30 mg by mouth 2 (two) times daily. 08/11/15   [provider]  Secukinumab (COSENTYX) 150 MG/ML SOSY Inject 300 mg into the skin every 30 (thirty) days.    [provider]  valACYclovir (VALTREX) 500 MG tablet Take 500 mg by mouth as needed (falre up).    [provider]  vitamin B-12 (CYANOCOBALAMIN) 100 MCG tablet Take 500 mcg by mouth daily.     [provider]    Allergies    Aleve [naproxen], Ibuprofen, Latex, Vicodin [hydrocodone-acetaminophen],  and Adhesive [tape]  Review of Systems   Review of Systems  Constitutional: Negative for fever.  Cardiovascular: Negative for chest pain.  Gastrointestinal: Positive for diarrhea, nausea and vomiting. Negative for abdominal pain.  Genitourinary: Negative for dysuria.  Neurological: Positive for headaches. Negative for weakness and numbness.  All other systems reviewed and are negative.   Physical Exam Updated Vital Signs BP (!) 158/109 (BP Location: Right Arm)   Pulse 64   Temp 98.2 F (36.8 C)  Resp 16   Wt 74 kg   SpO2 95%   BMI 28.01 kg/m   Physical Exam Vitals and nursing note reviewed.  Constitutional:      Appearance: She is well-developed. She is not ill-appearing.  HENT:     Head: Normocephalic and atraumatic.     Mouth/Throat:     Comments: Mucous membranes dry Eyes:     Pupils: Pupils are equal, round, and reactive to light.  Cardiovascular:     Rate and Rhythm: Normal rate and regular rhythm.     Heart sounds: Normal heart sounds.  Pulmonary:     Effort: Pulmonary effort is normal. No respiratory distress.     Breath sounds: No wheezing.  Abdominal:     General: Bowel sounds are normal.     Palpations: Abdomen is soft.  Musculoskeletal:     Cervical back: Normal range of motion and neck supple.  Skin:    General: Skin is warm and dry.  Neurological:     Mental Status: She is alert and oriented to person, place, and time.     Comments: Cranial nerves II through XII intact, 5 out of 5 strength in all 4 extremities, no dysmetria to finger-nose-finger  Psychiatric:        Mood and Affect: Mood normal.     ED Results / Procedures / Treatments   Labs (all labs ordered are listed, but only abnormal results are displayed) Labs Reviewed  URINALYSIS, ROUTINE W REFLEX MICROSCOPIC - Abnormal; Notable for the following components:      Result Value   Leukocytes,Ua TRACE (*)    All other components within normal limits  URINALYSIS, MICROSCOPIC (REFLEX) -  Abnormal; Notable for the following components:   Bacteria, UA RARE (*)    All other components within normal limits  CBC WITH DIFFERENTIAL/PLATELET - Abnormal; Notable for the following components:   MCV 77.6 (*)    MCH 24.8 (*)    RDW 17.1 (*)    All other components within normal limits  COMPREHENSIVE METABOLIC PANEL - Abnormal; Notable for the following components:   Chloride 97 (*)    Glucose, Bld 102 (*)    All other components within normal limits    EKG None  Radiology No results found.  Procedures Procedures (including critical care time)  Medications Ordered in ED Medications  diphenhydrAMINE (BENADRYL) injection 12.5 mg (12.5 mg Intravenous Not Given 08/03/19 0059)  sodium chloride 0.9 % bolus 500 mL ( Intravenous Stopped 08/03/19 0057)  prochlorperazine (COMPAZINE) injection 10 mg (10 mg Intravenous Given 08/03/19 0059)    ED Course  I have reviewed the triage vital signs and the nursing notes.  Pertinent labs & imaging results that were available during my care of the patient were reviewed by me and considered in my medical decision making (see chart for details).    MDM Rules/Calculators/A&P                           Patient presents with 24 hours of nausea, vomiting, diarrhea, headache.  She is overall nontoxic-appearing and vital signs are reassuring.  She is afebrile.  She has no signs or symptoms of meningitis.  Given constellation of symptoms, suspect viral etiology.  Abdomen is benign which would argue against any intra-abdominal pathology.  Clinically she does appear dry and without nausea and vomiting, will give patient's fluids.  Suspect headache is likely related to hydration status.  Low suspicion for subarachnoid or  other etiology.  Lab work obtained and reviewed.  No significant metabolic derangements.  No significant leukocytosis.  On recheck, patient states she feels much better.  We will send home with Zofran for nausea and vomiting.  After  history, exam, and medical workup I feel the patient has been appropriately medically screened and is safe for discharge home. Pertinent diagnoses were discussed with the patient. Patient was given return precautions.  Final Clinical Impression(s) / ED Diagnoses Final diagnoses:  Nausea vomiting and diarrhea  Acute viral syndrome    Rx / DC Orders ED Discharge Orders         Ordered    ondansetron (ZOFRAN ODT) 4 MG disintegrating tablet  Every 8 hours PRN     Discontinue  Reprint     08/03/19 0201           Eean Buss, Barbette Hair, MD 08/03/19 [phone]

## 2019-08-03 NOTE — ED Notes (Signed)
Pt ambulatory with steady gait to restroom 

## 2019-08-03 NOTE — Discharge Instructions (Addendum)
You were seen today for nausea, vomiting, diarrhea, and headache.  This is likely related to a viral illness.  Take Zofran as needed.  Make sure that you are staying hydrated.  Follow-up with your primary doctor.

## 2019-12-10 NOTE — Progress Notes (Signed)
Cardiology Office Note:    Date:  12/11/2019   ID:  Shannon Mason, DOB September 21, 1965, MRN [id number]  PCP:  Jonathon Jordan, MD  Cardiologist:  No primary care provider on file.   Referring MD: Jonathon Jordan, MD   Chief Complaint  Patient presents with  . Congestive Heart Failure    History of Present Illness:    Shannon Mason is a 54 y.o. female with a hx of acute systolic heart failure EF 15% in 2010 post-partum, recovered to LVEF 50% 2011, and HTN.   This is my first encounter with [name] since 2015.  She developed peripartum cardiomyopathy in 2010 with EF dropping as low as 15%.  By 2011 nearly recovered to 50%.  Unfortunately she developed COVID-19 disease in January 2021 but did not have to be hospitalized.  She was treated with monoclonal antibodies which she feels helped to keep her out of the hospital.  Since Covid she has had some orthopnea, notes a relatively high heart rate, and has some dyspnea on exertion.  She has not had chest pain.  No significant lower extremity swelling.  She is able to carry out all of her job responsibilities at the [employer].  She gets a lot of movement and activity each day.   Past Medical History:  Diagnosis Date  . Asthma   . Back pain   . DM (diabetes mellitus) ([place]) 2007   Gestational   . Eczema   . Fibroid    by MRI, 03-2008, s/p hysterectomy 05-2010  . Genital herpes    type 2  . Hernia, ventral 03-2008   small, by MRI   . Hip pain   . Hypertension   . Kidney disease    Dr Mercy Moore  . Neck pain   . Postpartum cardiomyopathy    with CHF, EF 15% 07-19-2008;EF 50% on 2D echo 09-2009    Past Surgical History:  Procedure Laterality Date  . ABDOMINAL HYSTERECTOMY  05-2010  . HERNIA REPAIR     2010  . OTHER SURGICAL HISTORY     Spleen repair  . RENAL BIOPSY     Benign  . TUBAL LIGATION    . US ECHOCARDIOGRAPHY     2D Echo EF 50% 09-2009    Current Medications: Current Meds  Medication Sig  .  acetaminophen (TYLENOL) 500 MG tablet Take 1,000 mg by mouth every 6 (six) hours as needed for pain (pain).  . Calcium Carbonate-Vitamin D (CALCIUM 500 + D) 500-125 MG-UNIT TABS Take 1 tablet by mouth daily.  . carvedilol (COREG) 6.25 MG tablet Take 0.5 tablets (3.125 mg total) by mouth 2 (two) times daily with a meal. NEED OV.  . cetirizine (ZYRTEC) 10 MG tablet Take 10 mg by mouth daily.   . Chromium 500 MCG TABS Take 500 mcg by mouth daily.  . DULoxetine (CYMBALTA) 60 MG capsule Take 1 capsule (60 mg total) by mouth daily.  . ferrous fumarate (HEMOCYTE - 106 MG FE) 325 (106 FE) MG TABS Take 1 tablet by mouth daily.  . folic acid (FOLVITE) 601 MCG tablet Take 400 mcg by mouth daily.  . Glucosamine-Chondroitin (GLUCOSAMINE CHONDR COMPLEX PO) Take by mouth. Taking 1500-1200 mg Twice a day  . guaiFENesin-codeine (ROBITUSSIN AC) 100-10 MG/5ML syrup Take 5 mLs by mouth 3 (three) times daily as needed for cough.  . Magnesium 250 MG TABS Take 250 mg by mouth daily.  . meclizine (ANTIVERT) 25 MG tablet Take 25 mg by mouth 3 (  three) times daily as needed (nausea).  . Menthol, Topical Analgesic, (ICY HOT BACK EX) Apply 1 application topically 3 (three) times daily as needed (pain).  . methotrexate (RHEUMATREX) 2.5 MG tablet Take 4 tablets by mouth 2 (two) times a week.   . Multiple Vitamins-Calcium (ONE-A-DAY WOMENS PO) Take by mouth daily.   Marland Kitchen olmesartan-hydrochlorothiazide (BENICAR HCT) 40-25 MG tablet Take 1 tablet by mouth daily.  . Omega-3 Fatty Acids (FISH OIL PO) Take 1 capsule by mouth daily.  . ondansetron (ZOFRAN ODT) 4 MG disintegrating tablet Take 1 tablet (4 mg total) by mouth every 8 (eight) hours as needed.  Marland Kitchen OTEZLA 30 MG TABS Take 30 mg by mouth 2 (two) times daily.  . Secukinumab (COSENTYX) 150 MG/ML SOSY Inject 300 mg into the skin every 30 (thirty) days.  . valACYclovir (VALTREX) 500 MG tablet Take 500 mg by mouth as needed (falre up).  . vitamin B-12 (CYANOCOBALAMIN) 100 MCG tablet  Take 500 mcg by mouth daily.   . [DISCONTINUED] losartan-hydrochlorothiazide (HYZAAR) 100-25 MG tablet Take 1 tablet by mouth daily.     Allergies:   Aleve [naproxen], Ibuprofen, Latex, Vicodin [hydrocodone-acetaminophen], and Adhesive [tape]   Social History   Socioeconomic History  . Marital status: Married    Spouse name: Not on file  . Number of children: 4  . Years of education: 2 years college  . Highest education level: Not on file  Occupational History  . Occupation: Garment/textile technologist  Tobacco Use  . Smoking status: Never Smoker  . Smokeless tobacco: Never Used  Substance and Sexual Activity  . Alcohol use: No  . Drug use: No  . Sexual activity: Not on file  Other Topics Concern  . Not on file  Social History Narrative   Lives at home with husband.   Right-handed.   2 cups caffeine per day.   Social Determinants of Health   Financial Resource Strain:   . Difficulty of Paying Living Expenses: Not on file  Food Insecurity:   . Worried About Charity fundraiser in the Last Year: Not on file  . Ran Out of Food in the Last Year: Not on file  Transportation Needs:   . Lack of Transportation (Medical): Not on file  . Lack of Transportation (Non-Medical): Not on file  Physical Activity:   . Days of Exercise per Week: Not on file  . Minutes of Exercise per Session: Not on file  Stress:   . Feeling of Stress : Not on file  Social Connections:   . Frequency of Communication with Friends and Family: Not on file  . Frequency of Social Gatherings with Friends and Family: Not on file  . Attends Religious Services: Not on file  . Active Member of Clubs or Organizations: Not on file  . Attends Archivist Meetings: Not on file  . Marital Status: Not on file     Family History: The patient's family history includes Brain cancer in her sister; Breast cancer in her mother, sister, and sister; Cardiomyopathy in her father; Congestive Heart Failure in her father;  Hypertension in her father; Pneumonia in her father.  ROS:   Please see the history of present illness.    She has occasional lightheadedness and weakness when she goes from sitting to standing.  This also happens sometimes at work with just standing and carrying out her responsibilities.  All other systems reviewed and are negative.  EKGs/Labs/Other Studies Reviewed:    The following studies were  reviewed today: No recent cardiac imaging or data.  EKG:  EKG normal sinus rhythm/sinus tachycardia 102 bpm, biatrial abnormality, nonspecific T wave flattening.  Recent Labs: 08/03/2019: ALT 25; BUN 11; Creatinine, Ser 0.66; Hemoglobin 12.3; Platelets 300; Potassium 3.7; Sodium 137  Recent Lipid Panel    Component Value Date/Time   CHOL  07/20/2008 0355    140        ATP III CLASSIFICATION:  <200     mg/dL   Desirable  200-239  mg/dL   Borderline High  >=240    mg/dL   High          TRIG 122 07/20/2008 0355   HDL 32 (L) 07/20/2008 0355   CHOLHDL 4.4 07/20/2008 0355   VLDL 24 07/20/2008 0355   LDLCALC  07/20/2008 0355    84        Total Cholesterol/HDL:CHD Risk Coronary Heart Disease Risk Table                     Men   Women  1/2 Average Risk   3.4   3.3  Average Risk       5.0   4.4  2 X Average Risk   9.6   7.1  3 X Average Risk  23.4   11.0        Use the calculated Patient Ratio above and the CHD Risk Table to determine the patient's CHD Risk.        ATP III CLASSIFICATION (LDL):  <100     mg/dL   Optimal  100-129  mg/dL   Near or Above                    Optimal  130-159  mg/dL   Borderline  160-189  mg/dL   High  >190     mg/dL   Very High    Physical Exam:    VS:  BP 110/60   Pulse (!) 102   Ht 5\' 4"  (1.626 m)   Wt 163 lb 6.4 oz (74.1 kg)   SpO2 97%   BMI 28.05 kg/m     Wt Readings from Last 3 Encounters:  12/11/19 163 lb 6.4 oz (74.1 kg)  08/02/19 163 lb 3.2 oz (74 kg)  02/21/17 148 lb (67.1 kg)     GEN: Mildly overweight. No acute distress HEENT:  Normal NECK: No JVD. LYMPHATICS: No lymphadenopathy CARDIAC:  RRR without murmur, gallop, or edema. VASCULAR:  Normal Pulses. No bruits. RESPIRATORY:  Clear to auscultation without rales, wheezing or rhonchi  ABDOMEN: Soft, non-tender, non-distended, No pulsatile mass, MUSCULOSKELETAL: No deformity  SKIN: Warm and dry NEUROLOGIC:  Alert and oriented x 3 PSYCHIATRIC:  Normal affect   ASSESSMENT:    1. Chronic diastolic heart failure ([place])   2. Sinus tachycardia   3. SOB (shortness of breath)   4. Post covid-19 condition, unspecified   5. Peripartum cardiomyopathy   6. Essential hypertension, benign   7. Sickle-cell trait ([place])   8. Educated about COVID-19 virus infection    PLAN:    In order of problems listed above:  1. No evidence of volume overload although I am concerned with what sounds like orthopnea and the slight increase in resting heart rate.   2. Recent TSH was 1.89 in May.  This may need to be reevaluated.  Need to exclude decreased LV systolic function given history of heart failure and Covid 19 disease in January. 3. No evidence of  volume overload on exam.  Possibly post Covid. 4. Had Covid disease in January.  Out of work for 2 weeks.  Received monoclonal antibody.  Has been short of breath with less energy despite suppose it complete recovery. 5. Repeat 2D Doppler echocardiogram of the heart to establish stable systolic function given above commentary and relatively recent COVID-19 infection. 6. Excellent blood pressure control. 7. She has sickle cell beta Thal trait.  This is followed by her primary care. 8. She is vaccinated and will receive a booster.  Assuming no abnormality on echo, will have her return in 1 year.  If echo does not reveal significant decrease in LV function, will increase dose of carvedilol to slow her heart rate.  Perhaps she has autonomic dysfunction related to COVID-19 infection.   Medication Adjustments/Labs and Tests Ordered: Current  medicines are reviewed at length with the patient today.  Concerns regarding medicines are outlined above.  Orders Placed This Encounter  Procedures  . EKG 12-Lead  . ECHOCARDIOGRAM COMPLETE   No orders of the defined types were placed in this encounter.   Patient Instructions  Medication Instructions:  Your physician recommends that you continue on your current medications as directed. Please refer to the Current Medication list given to you today.  *If you need a refill on your cardiac medications before your next appointment, please call your pharmacy*   Lab Work: None If you have labs (blood work) drawn today and your tests are completely normal, you will receive your results only by: Marland Kitchen MyChart Message (if you have MyChart) OR . A paper copy in the mail If you have any lab test that is abnormal or we need to change your treatment, we will call you to review the results.   Testing/Procedures: Your physician has requested that you have an echocardiogram. Echocardiography is a painless test that uses sound waves to create images of your heart. It provides your doctor with information about the size and shape of your heart and how well your heart's chambers and valves are working. This procedure takes approximately one hour. There are no restrictions for this procedure.    Follow-Up: At [facility], you and your health needs are our priority.  As part of our continuing mission to provide you with exceptional heart care, we have created designated Provider Care Teams.  These Care Teams include your primary Cardiologist (physician) and Advanced Practice Providers (APPs -  Physician Assistants and Nurse Practitioners) who all work together to provide you with the care you need, when you need it.  We recommend signing up for the patient portal called "MyChart".  Sign up information is provided on this After Visit Summary.  MyChart is used to connect with patients for Virtual Visits  (Telemedicine).  Patients are able to view lab/test results, encounter notes, upcoming appointments, etc.  Non-urgent messages can be sent to your provider as well.   To learn more about what you can do with MyChart, go to [email or web address].    Your next appointment:   12 month(s)  The format for your next appointment:   In Person  Provider:   You may see Dr. Daneen Schick or one of the following Advanced Practice Providers on your designated Care Team:    Truitt Merle, NP  Cecilie Kicks, NP  Kathyrn Drown, NP    Other Instructions      Signed, Sinclair Grooms, MD  12/11/2019 12:16 PM    [place]

## 2019-12-11 ENCOUNTER — Other Ambulatory Visit: Payer: Self-pay

## 2019-12-11 ENCOUNTER — Ambulatory Visit: Payer: 59 | Admitting: Interventional Cardiology

## 2019-12-11 ENCOUNTER — Encounter: Payer: Self-pay | Admitting: Interventional Cardiology

## 2019-12-11 VITALS — BP 110/60 | HR 102 | Ht 64.0 in | Wt 163.4 lb

## 2019-12-11 DIAGNOSIS — Z7189 Other specified counseling: Secondary | ICD-10-CM

## 2019-12-11 DIAGNOSIS — R0602 Shortness of breath: Secondary | ICD-10-CM

## 2019-12-11 DIAGNOSIS — R000 Tachycardia, unspecified: Secondary | ICD-10-CM | POA: Diagnosis not present

## 2019-12-11 DIAGNOSIS — U099 Post covid-19 condition, unspecified: Secondary | ICD-10-CM

## 2019-12-11 DIAGNOSIS — D573 Sickle-cell trait: Secondary | ICD-10-CM

## 2019-12-11 DIAGNOSIS — I5032 Chronic diastolic (congestive) heart failure: Secondary | ICD-10-CM | POA: Diagnosis not present

## 2019-12-11 DIAGNOSIS — O903 Peripartum cardiomyopathy: Secondary | ICD-10-CM

## 2019-12-11 DIAGNOSIS — N182 Chronic kidney disease, stage 2 (mild): Secondary | ICD-10-CM

## 2019-12-11 DIAGNOSIS — I10 Essential (primary) hypertension: Secondary | ICD-10-CM

## 2019-12-11 NOTE — Patient Instructions (Signed)
Medication Instructions:  Your physician recommends that you continue on your current medications as directed. Please refer to the Current Medication list given to you today.  *If you need a refill on your cardiac medications before your next appointment, please call your pharmacy*   Lab Work: None If you have labs (blood work) drawn today and your tests are completely normal, you will receive your results only by: Marland Kitchen MyChart Message (if you have MyChart) OR . A paper copy in the mail If you have any lab test that is abnormal or we need to change your treatment, we will call you to review the results.   Testing/Procedures: Your physician has requested that you have an echocardiogram. Echocardiography is a painless test that uses sound waves to create images of your heart. It provides your doctor with information about the size and shape of your heart and how well your heart's chambers and valves are working. This procedure takes approximately one hour. There are no restrictions for this procedure.    Follow-Up: At [facility], you and your health needs are our priority.  As part of our continuing mission to provide you with exceptional heart care, we have created designated Provider Care Teams.  These Care Teams include your primary Cardiologist (physician) and Advanced Practice Providers (APPs -  Physician Assistants and Nurse Practitioners) who all work together to provide you with the care you need, when you need it.  We recommend signing up for the patient portal called "MyChart".  Sign up information is provided on this After Visit Summary.  MyChart is used to connect with patients for Virtual Visits (Telemedicine).  Patients are able to view lab/test results, encounter notes, upcoming appointments, etc.  Non-urgent messages can be sent to your provider as well.   To learn more about what you can do with MyChart, go to [email or web address].    Your next appointment:   12  month(s)  The format for your next appointment:   In Person  Provider:   You may see Dr. Daneen Schick or one of the following Advanced Practice Providers on your designated Care Team:    Truitt Merle, NP  Cecilie Kicks, NP  Kathyrn Drown, NP    Other Instructions

## 2020-01-03 ENCOUNTER — Other Ambulatory Visit: Payer: Self-pay | Admitting: Cardiology

## 2020-01-03 DIAGNOSIS — I10 Essential (primary) hypertension: Secondary | ICD-10-CM

## 2020-01-03 DIAGNOSIS — I5032 Chronic diastolic (congestive) heart failure: Secondary | ICD-10-CM

## 2020-01-07 ENCOUNTER — Other Ambulatory Visit (HOSPITAL_COMMUNITY): Payer: 59

## 2020-01-21 ENCOUNTER — Other Ambulatory Visit: Payer: Self-pay | Admitting: Cardiology

## 2020-01-21 DIAGNOSIS — I10 Essential (primary) hypertension: Secondary | ICD-10-CM

## 2020-01-21 DIAGNOSIS — I5032 Chronic diastolic (congestive) heart failure: Secondary | ICD-10-CM

## 2020-01-28 ENCOUNTER — Ambulatory Visit (HOSPITAL_COMMUNITY): Payer: 59 | Attending: Internal Medicine

## 2020-01-28 ENCOUNTER — Other Ambulatory Visit: Payer: Self-pay

## 2020-01-28 DIAGNOSIS — O903 Peripartum cardiomyopathy: Secondary | ICD-10-CM | POA: Insufficient documentation

## 2020-01-28 DIAGNOSIS — I5032 Chronic diastolic (congestive) heart failure: Secondary | ICD-10-CM | POA: Diagnosis not present

## 2020-01-28 DIAGNOSIS — R0602 Shortness of breath: Secondary | ICD-10-CM | POA: Insufficient documentation

## 2020-01-28 LAB — ECHOCARDIOGRAM COMPLETE
Area-P 1/2: 5.13 cm2
S' Lateral: 2.9 cm

## 2020-02-27 ENCOUNTER — Emergency Department (HOSPITAL_BASED_OUTPATIENT_CLINIC_OR_DEPARTMENT_OTHER): Payer: 59

## 2020-02-27 ENCOUNTER — Emergency Department (HOSPITAL_BASED_OUTPATIENT_CLINIC_OR_DEPARTMENT_OTHER)
Admission: EM | Admit: 2020-02-27 | Discharge: 2020-02-27 | Disposition: A | Discharge status: Home / Self Care | Payer: 59 | Attending: Emergency Medicine | Admitting: Emergency Medicine

## 2020-02-27 ENCOUNTER — Encounter (HOSPITAL_BASED_OUTPATIENT_CLINIC_OR_DEPARTMENT_OTHER): Payer: Self-pay | Admitting: Emergency Medicine

## 2020-02-27 DIAGNOSIS — Z79899 Other long term (current) drug therapy: Secondary | ICD-10-CM | POA: Insufficient documentation

## 2020-02-27 DIAGNOSIS — N182 Chronic kidney disease, stage 2 (mild): Secondary | ICD-10-CM | POA: Diagnosis not present

## 2020-02-27 DIAGNOSIS — I5032 Chronic diastolic (congestive) heart failure: Secondary | ICD-10-CM | POA: Insufficient documentation

## 2020-02-27 DIAGNOSIS — I130 Hypertensive heart and chronic kidney disease with heart failure and stage 1 through stage 4 chronic kidney disease, or unspecified chronic kidney disease: Secondary | ICD-10-CM | POA: Insufficient documentation

## 2020-02-27 DIAGNOSIS — Z91040 Latex allergy status: Secondary | ICD-10-CM | POA: Insufficient documentation

## 2020-02-27 DIAGNOSIS — M79675 Pain in left toe(s): Secondary | ICD-10-CM | POA: Diagnosis not present

## 2020-02-27 DIAGNOSIS — J45909 Unspecified asthma, uncomplicated: Secondary | ICD-10-CM | POA: Insufficient documentation

## 2020-02-27 NOTE — ED Provider Notes (Signed)
[facility] EMERGENCY DEPARTMENT Provider Note   CSN: [id number] Arrival date & time: 02/27/20  1126     History Chief Complaint  Patient presents with  . Toe Injury    Shannon Mason is a 55 y.o. female.  HPI   Patient is a 55 year old female with a medical history as noted below.  Presents to the emergency department due to left fifth toe pain.  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  Pain worsens with palpation as well as ambulation.  Applied ice to the toe last night.  Patient was concerned that she broke the toe so she came to the emergency department.  No numbness.     Past Medical History:  Diagnosis Date  . Asthma   . Back pain   . DM (diabetes mellitus) ([place]) 2007   Gestational   . Eczema   . Fibroid    by MRI, 03-2008, s/p hysterectomy 05-2010  . Genital herpes    type 2  . Hernia, ventral 03-2008   small, by MRI   . Hip pain   . Hypertension   . Kidney disease    Dr Mercy Moore  . Neck pain   . Postpartum cardiomyopathy    with CHF, EF 15% 07-19-2008;EF 50% on 2D echo 09-2009    Patient Active Problem List   Diagnosis Date Noted  . Closed fracture of phalanx of right fourth toe 02/26/2017  . Low back pain 06/21/2016  . Neck pain 05/24/2016  . Paresthesia 05/24/2016  . Chronic diastolic heart failure ([place]) 08/06/2013  . Essential hypertension, benign 08/06/2013  . Sickle-cell trait ([place]) 08/06/2013  . Chronic kidney disease, stage II (mild) 08/06/2013  . Ventral hernia 06/09/2013    Past Surgical History:  Procedure Laterality Date  . ABDOMINAL HYSTERECTOMY  05-2010  . HERNIA REPAIR     2010  . OTHER SURGICAL HISTORY     Spleen repair  . RENAL BIOPSY     Benign  . TUBAL LIGATION    . US ECHOCARDIOGRAPHY     2D Echo EF 50% 09-2009     OB History   No obstetric history on file.     Family History  Problem Relation Age of Onset  . Breast cancer Mother   .  Hypertension Father   . Congestive Heart Failure Father   . Cardiomyopathy Father        has AICD  . Pneumonia Father   . Brain cancer Sister   . Breast cancer Sister   . Breast cancer Sister     Social History   Tobacco Use  . Smoking status: Never Smoker  . Smokeless tobacco: Never Used  Substance Use Topics  . Alcohol use: Not Currently    Comment: occ  . Drug use: No    Home Medications Prior to Admission medications   Medication Sig Start Date End Date Taking? Authorizing Provider  acetaminophen (TYLENOL) 500 MG tablet Take 1,000 mg by mouth every 6 (six) hours as needed for pain (pain).    [provider]  Calcium Carbonate-Vitamin D (CALCIUM 500 + D) 500-125 MG-UNIT TABS Take 1 tablet by mouth daily.    [provider]  carvedilol (COREG) 6.25 MG tablet TAKE 1/2 TABLET BY MOUTH 2 (TWO) TIMES DAILY WITH A MEAL. 01/21/20   Isaiah Serge, NP  cetirizine (ZYRTEC) 10 MG tablet Take 10 mg by mouth daily.  [provider]  Chromium 500 MCG TABS Take 500 mcg by mouth daily.    [provider]  DULoxetine (CYMBALTA) 60 MG capsule Take 1 capsule (60 mg total) by mouth daily. 07/16/16   Marcial Pacas, MD  ferrous fumarate (HEMOCYTE - 106 MG FE) 325 (106 FE) MG TABS Take 1 tablet by mouth daily.    [provider]  folic acid (FOLVITE) 833 MCG tablet Take 400 mcg by mouth daily.    [provider]  Glucosamine-Chondroitin (GLUCOSAMINE CHONDR COMPLEX PO) Take by mouth. Taking 1500-1200 mg Twice a day    [provider]  guaiFENesin-codeine (ROBITUSSIN AC) 100-10 MG/5ML syrup Take 5 mLs by mouth 3 (three) times daily as needed for cough. 02/04/17   Glyn Ade, PA-C  Magnesium 250 MG TABS Take 250 mg by mouth daily.    [provider]  meclizine (ANTIVERT) 25 MG tablet Take 25 mg by mouth 3 (three) times daily as needed (nausea).    [provider]  Menthol, Topical Analgesic, (ICY HOT BACK EX) Apply  1 application topically 3 (three) times daily as needed (pain).    [provider]  methotrexate (RHEUMATREX) 2.5 MG tablet Take 4 tablets by mouth 2 (two) times a week.  12/31/16   [provider]  Multiple Vitamins-Calcium (ONE-A-DAY WOMENS PO) Take by mouth daily.     [provider]  olmesartan-hydrochlorothiazide (BENICAR HCT) 40-25 MG tablet Take 1 tablet by mouth daily. 12/02/19   [provider]  Omega-3 Fatty Acids (FISH OIL PO) Take 1 capsule by mouth daily.    [provider]  ondansetron (ZOFRAN ODT) 4 MG disintegrating tablet Take 1 tablet (4 mg total) by mouth every 8 (eight) hours as needed. 08/03/19   Horton, Barbette Hair, MD  OTEZLA 30 MG TABS Take 30 mg by mouth 2 (two) times daily. 08/11/15   [provider]  Secukinumab (COSENTYX) 150 MG/ML SOSY Inject 300 mg into the skin every 30 (thirty) days.    [provider]  valACYclovir (VALTREX) 500 MG tablet Take 500 mg by mouth as needed (falre up).    [provider]  vitamin B-12 (CYANOCOBALAMIN) 100 MCG tablet Take 500 mcg by mouth daily.     [provider]    Allergies    Aleve [naproxen], Ibuprofen, Latex, Vicodin [hydrocodone-acetaminophen], and Adhesive [tape]  Review of Systems   Review of Systems  Musculoskeletal: Positive for arthralgias and myalgias.  Neurological: Negative for weakness and numbness.   Physical Exam Updated Vital Signs BP 128/87 (BP Location: Left Arm)   Pulse 88   Temp 98.5 F (36.9 C) (Oral)   Resp 18   Ht 5\' 4"  (1.626 m)   Wt 72.6 kg   SpO2 99%   BMI 27.46 kg/m   Physical Exam Vitals and nursing note reviewed.  Constitutional:      General: She is not in acute distress.    Appearance: Normal appearance. She is not ill-appearing, toxic-appearing or diaphoretic.  HENT:     Head: Normocephalic and atraumatic.     Right Ear: External ear normal.     Left Ear: External ear normal.     Nose: Nose normal.      Mouth/Throat:     Pharynx: Oropharynx is clear.  Eyes:     Extraocular Movements: Extraocular movements intact.  Cardiovascular:     Rate and Rhythm: Normal rate.     Pulses: Normal pulses.  Pulmonary:     Effort:  Pulmonary effort is normal. No respiratory distress.     Breath sounds: No stridor.  Abdominal:     General: Abdomen is flat.     Tenderness: There is no abdominal tenderness.  Musculoskeletal:        General: Tenderness present. Normal range of motion.     Cervical back: Normal range of motion and neck supple. No tenderness.     Comments: Diffuse tenderness noted along the left fifth toe.  No swelling or overlying skin changes.  Unable to assess range of motion due to patient's pain.  Distal sensation intact.  Good cap refill.  Palpable pedal pulses.  Skin:    General: Skin is warm and dry.  Neurological:     General: No focal deficit present.     Mental Status: She is alert and oriented to person, place, and time.  Psychiatric:        Mood and Affect: Mood normal.        Behavior: Behavior normal.    ED Results / Procedures / Treatments   Labs (all labs ordered are listed, but only abnormal results are displayed) Labs Reviewed - No data to display  EKG None  Radiology DG Foot Complete Left  Result Date: 02/27/2020 CLINICAL DATA:  Injury with swelling of the left fifth toe. EXAM: LEFT FOOT - COMPLETE 3+ VIEW COMPARISON:  None. FINDINGS: There is no evidence of fracture or dislocation. There is no evidence of arthropathy or other focal bone abnormality. Soft tissues are unremarkable. IMPRESSION: Negative. Electronically Signed   By: Abelardo Diesel M.D.   On: 02/27/2020 12:26   Procedures Procedures (including critical care time)  Medications Ordered in ED Medications - No data to display  ED Course  I have reviewed the triage vital signs and the nursing notes.  Pertinent labs & imaging results that were available during my care of the patient were reviewed by  me and considered in my medical decision making (see chart for details).    MDM Rules/Calculators/A&P                          Patient presents with left fifth toe pain.  Neurovascularly intact in the foot and toe.  X-ray obtained which was negative.  We will give patient a postop shoe.  Patient has a history of CKD, so recommended continued use of Tylenol for pain.  RICE method.  Return to the ER with new or worsening symptoms.  Final Clinical Impression(s) / ED Diagnoses Final diagnoses:  Pain of toe of left foot   Rx / DC Orders ED Discharge Orders    None       Rayna Sexton, PA-C 02/27/20 1259    Gareth Morgan, MD 02/29/20 1112

## 2020-02-27 NOTE — Discharge Instructions (Signed)
Please return to the ER with any new or worsening symptoms. It was a pleasure to meet you.  

## 2020-02-27 NOTE — ED Triage Notes (Signed)
Pt arrives pov with c/o left toe injury, 5th digit last night. Pt endorses swelling, tenderness and bruising

## 2021-05-17 ENCOUNTER — Other Ambulatory Visit: Payer: Self-pay | Admitting: Cardiology

## 2021-05-17 DIAGNOSIS — I5032 Chronic diastolic (congestive) heart failure: Secondary | ICD-10-CM

## 2021-05-17 DIAGNOSIS — I10 Essential (primary) hypertension: Secondary | ICD-10-CM

## 2021-05-31 ENCOUNTER — Other Ambulatory Visit: Payer: Self-pay | Admitting: Interventional Cardiology

## 2021-05-31 DIAGNOSIS — I10 Essential (primary) hypertension: Secondary | ICD-10-CM

## 2021-05-31 DIAGNOSIS — I5032 Chronic diastolic (congestive) heart failure: Secondary | ICD-10-CM

## 2021-05-31 NOTE — Progress Notes (Signed)
? ? ?Office Visit  ?  ?Patient Name: Shannon Mason ?Date of Encounter: 06/01/2021 ? ?Primary Care Provider:  Jonathon Jordan, MD ?Primary Cardiologist:  Sinclair Grooms, MD ?Primary Electrophysiologist: None ? ?Patient Profile  ?  ?Chief Complaint: 1 year follow-up for congestive heart failure and hypertension ? ? Notable History: ?Acute systolic HF EF 44% in 9675 postpartum recovered to 50% in 2011 ?CKD (previous diagnosis, now normal renal function) ?HTN ?Sickle cell trait ? Recent Studies: ?2D echo 01/2020: EF 60-65%, normal LV function, no RWMA, mild LVH, grade 1 DD, mild MV regurgitation ? ?History of Present Illness  ?  ?Shannon Mason is a 56 y.o. female with PMH of HTN, CKD, sickle cell trait, acute systolic HF with postpartum cardiomyopathy and 2010 with EF of 50% and recovery of EF of 50% in 2011.  She has been followed for management of CHF and hypertension. She contracted COVID-19 in 2021 and was treated with monoclonal antibodies. She developed orthopnea with elevated heart rate and some dyspnea on exertion. 2D echo was completed 01/2020 with EF of 60-65%, normal LV function with mild LVH and grade 1 DD and  MV regurgitation. She is managed with Coreg 3.125 mg twice daily and Benicar 40-25 mg. ? ?Since being seen by Dr. Tamala Julian in August of 2021 Ms. Camino reports that she is doing well with no new cardiac complaints at this time.  She does endorse increased fatigue since contracting COVID 6 in 2021 however she states that she is able to complete her daily tasks and this is not debilitating. She denies chest pain, palpitations, dyspnea, PND, orthopnea, nausea, vomiting, dizziness, syncope, edema, weight gain, or early satiety.  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  She maintains a healthy diet and states that she watches her salt intake doesn't salt her food. ?Past Medical History  ?  ?Past Medical History:   ?Diagnosis Date  ? Asthma   ? Back pain   ? DM (diabetes mellitus) ([place]) 2007  ? Gestational   ? Eczema   ? Fibroid   ? by MRI, 03-2008, s/p hysterectomy 05-2010  ? Genital herpes   ? type 2  ? Hernia, ventral 03-2008  ? small, by MRI   ? Hip pain   ? Hypertension   ? Kidney disease   ? Dr Mercy Moore  ? Neck pain   ? Postpartum cardiomyopathy   ? with CHF, EF 15% 07-19-2008;EF 50% on 2D echo 09-2009  ? ?Past Surgical History:  ?Procedure Laterality Date  ? ABDOMINAL HYSTERECTOMY  05-2010  ? HERNIA REPAIR    ? 2010  ? OTHER SURGICAL HISTORY    ? Spleen repair  ? RENAL BIOPSY    ? Benign  ? TUBAL LIGATION    ? US ECHOCARDIOGRAPHY    ? 2D Echo EF 50% 09-2009  ? ? ?Allergies ? ?Allergies  ?Allergen Reactions  ? Aleve [Naproxen]   ? Ibuprofen   ?  Kidney disease  ? Latex Itching  ? Vicodin [Hydrocodone-Acetaminophen] Nausea Only  ? Adhesive [Tape] Itching and Rash  ? ? ?Home Medications  ?  ?Current Outpatient Medications  ?Medication Sig Dispense Refill  ? acetaminophen (TYLENOL) 500 MG tablet Take 1,000 mg by mouth every 6 (six) hours as needed for pain (pain).    ? Calcium Carbonate-Vitamin D 500-125 MG-UNIT TABS Take 1 tablet by mouth daily.    ? carvedilol (COREG) 6.25  MG tablet TAKE 1/2 TABLET BY MOUTH 2 (TWO) TIMES DAILY WITH A MEAL. 30 tablet 0  ? cetirizine (ZYRTEC) 10 MG tablet Take 10 mg by mouth daily.     ? folic acid (FOLVITE) 527 MCG tablet Take 400 mcg by mouth daily.    ? Glucosamine-Chondroitin (GLUCOSAMINE CHONDR COMPLEX PO) Take by mouth. Taking 1500-1200 mg Twice a day    ? Magnesium 250 MG TABS Take 250 mg by mouth daily.    ? methotrexate (RHEUMATREX) 2.5 MG tablet Take 4 tablets by mouth 2 (two) times a week.   1  ? Multiple Vitamins-Calcium (ONE-A-DAY WOMENS PO) Take by mouth daily.     ? olmesartan-hydrochlorothiazide (BENICAR HCT) 40-25 MG tablet Take 1 tablet by mouth daily.    ? Omega-3 Fatty Acids (FISH OIL PO) Take 1 capsule by mouth daily.    ? ondansetron (ZOFRAN ODT) 4 MG disintegrating tablet  Take 1 tablet (4 mg total) by mouth every 8 (eight) hours as needed. 20 tablet 0  ? OTEZLA 30 MG TABS Take 30 mg by mouth 2 (two) times daily.    ? Secukinumab 150 MG/ML SOSY Inject 300 mg into the skin every 30 (thirty) days.    ? valACYclovir (VALTREX) 500 MG tablet Take 500 mg by mouth as needed (falre up).    ? Chromium 500 MCG TABS Take 500 mcg by mouth daily. (Patient not taking: Reported on 06/01/2021)    ? DULoxetine (CYMBALTA) 60 MG capsule Take 1 capsule (60 mg total) by mouth daily. (Patient not taking: Reported on 06/01/2021) 90 capsule 3  ? ferrous fumarate (HEMOCYTE - 106 MG FE) 325 (106 FE) MG TABS Take 1 tablet by mouth daily. (Patient not taking: Reported on 06/01/2021)    ? guaiFENesin-codeine (ROBITUSSIN AC) 100-10 MG/5ML syrup Take 5 mLs by mouth 3 (three) times daily as needed for cough. (Patient not taking: Reported on 06/01/2021) 120 mL 0  ? meclizine (ANTIVERT) 25 MG tablet Take 25 mg by mouth 3 (three) times daily as needed (nausea). (Patient not taking: Reported on 06/01/2021)    ? Menthol, Topical Analgesic, (ICY HOT BACK EX) Apply 1 application topically 3 (three) times daily as needed (pain). (Patient not taking: Reported on 06/01/2021)    ? vitamin B-12 (CYANOCOBALAMIN) 100 MCG tablet Take 500 mcg by mouth daily.  (Patient not taking: Reported on 06/01/2021)    ? ?No current facility-administered medications for this visit.  ?  ? ?Review of Systems  ?Please see the history of present illness.    ?(+) Fatigue without shortness of breath ?All other systems reviewed and are otherwise negative except as noted above. ? ?Physical Exam  ?  ?Wt Readings from Last 3 Encounters:  ?06/01/21 163 lb 6.4 oz (74.1 kg)  ?02/27/20 160 lb (72.6 kg)  ?12/11/19 163 lb 6.4 oz (74.1 kg)  ? ?VS: ?Vitals:  ? 06/01/21 1128  ?BP: 124/78  ?Pulse: 91  ?SpO2: 97%  ?,Body mass index is 28.05 kg/m?. ? ?Constitutional:   ?   Appearance: Healthy appearance. Not in distress.  ?Neck:  ?   Vascular: JVD normal.  ?Pulmonary:  ?    Effort: Pulmonary effort is normal.  ?   Breath sounds: No wheezing. No rales. Diminished in the bases ?Cardiovascular:  ?   Normal rate. Regular rhythm. Normal S1. Normal S2.   ?   Murmurs: There is no murmur.  ?Edema: ?   Peripheral edema absent.  ?Abdominal:  ?   Palpations: Abdomen is soft  non tender. There is no hepatomegaly.  ?Skin: ?   General: Skin is warm and dry.  ?Neurological:  ?   General: No focal deficit present.  ?   Mental Status: Alert and oriented to person, place and time.  ?   Cranial Nerves: Cranial nerves are intact.  ?EKG/LABS/Other Studies Reviewed  ?  ?ECG personally reviewed by me today -normal sinus rhythm with rate of 91 bpm normal axis. ? ?Lab Results  ?Component Value Date  ? WBC 7.4 08/03/2019  ? HGB 12.3 08/03/2019  ? HCT 38.4 08/03/2019  ? MCV 77.6 (L) 08/03/2019  ? PLT 300 08/03/2019  ? ?Lab Results  ?Component Value Date  ? CREATININE 0.66 08/03/2019  ? BUN 11 08/03/2019  ? NA 137 08/03/2019  ? K 3.7 08/03/2019  ? CL 97 (L) 08/03/2019  ? CO2 28 08/03/2019  ? ?Lab Results  ?Component Value Date  ? ALT 25 08/03/2019  ? AST 24 08/03/2019  ? ALKPHOS 67 08/03/2019  ? BILITOT 0.7 08/03/2019  ? ?Lab Results  ?Component Value Date  ? CHOL  07/20/2008  ?  140        ?ATP III CLASSIFICATION: ? <200     mg/dL   Desirable ? 200-239  mg/dL   Borderline High ? >=240    mg/dL   High ?        ? HDL 32 (L) 07/20/2008  ? [place]  07/20/2008  ?  84        ?Total Cholesterol/HDL:CHD Risk ?Coronary Heart Disease Risk Table ?                    Men   Women ? 1/2 Average Risk   3.4   3.3 ? Average Risk       5.0   4.4 ? 2 X Average Risk   9.6   7.1 ? 3 X Average Risk  23.4   11.0 ?       ?Use the calculated Patient Ratio ?above and the CHD Risk Table ?to determine the patient's CHD Risk. ?       ?ATP III CLASSIFICATION (LDL): ? <100     mg/dL   Optimal ? 100-129  mg/dL   Near or Above ?                   Optimal ? 130-159  mg/dL   Borderline ? 160-189  mg/dL   High ? >190     mg/dL   Very High  ? TRIG  122 07/20/2008  ? CHOLHDL 4.4 07/20/2008  ?  ?No results found for: HGBA1C ? ?Assessment & Plan  ?  ?1.  Chronic diastolic heart failure, remote hx of postpartum cardiomyopathy ?-Patient appear euvolemic on

## 2021-06-01 ENCOUNTER — Encounter: Payer: Self-pay | Admitting: Nurse Practitioner

## 2021-06-01 ENCOUNTER — Ambulatory Visit: Payer: 59 | Admitting: Nurse Practitioner

## 2021-06-01 VITALS — BP 124/78 | HR 91 | Ht 64.0 in | Wt 163.4 lb

## 2021-06-01 DIAGNOSIS — I5032 Chronic diastolic (congestive) heart failure: Secondary | ICD-10-CM

## 2021-06-01 DIAGNOSIS — N182 Chronic kidney disease, stage 2 (mild): Secondary | ICD-10-CM

## 2021-06-01 DIAGNOSIS — I10 Essential (primary) hypertension: Secondary | ICD-10-CM

## 2021-06-01 DIAGNOSIS — R0602 Shortness of breath: Secondary | ICD-10-CM

## 2021-06-01 DIAGNOSIS — I340 Nonrheumatic mitral (valve) insufficiency: Secondary | ICD-10-CM

## 2021-06-01 NOTE — Patient Instructions (Addendum)
Medication Instructions:  ?Your physician recommends that you continue on your current medications as directed. Please refer to the Current Medication list given to you today. ?*If you need a refill on your cardiac medications before your next appointment, please call your pharmacy* ? ? ?Lab Work: ?None Ordered ? ? ?Testing/Procedures: ?Your physician has requested that you have an echocardiogram. Echocardiography is a painless test that uses sound waves to create images of your heart. It provides your doctor with information about the size and shape of your heart and how well your heart?s chambers and valves are working. This procedure takes approximately one hour. There are no restrictions for this procedure. ? ? ?Follow-Up: ?At [facility], you and your health needs are our priority.  As part of our continuing mission to provide you with exceptional heart care, we have created designated Provider Care Teams.  These Care Teams include your primary Cardiologist (physician) and Advanced Practice Providers (APPs -  Physician Assistants and Nurse Practitioners) who all work together to provide you with the care you need, when you need it. ? ?We recommend signing up for the patient portal called "MyChart".  Sign up information is provided on this After Visit Summary.  MyChart is used to connect with patients for Virtual Visits (Telemedicine).  Patients are able to view lab/test results, encounter notes, upcoming appointments, etc.  Non-urgent messages can be sent to your provider as well.   ?To learn more about what you can do with MyChart, go to [email or web address].   ? ?Your next appointment:   ?1 year(s) ? ?The format for your next appointment:   ?In Person ? ?Provider:   ?Daneen Schick, MD ? ? ?Other Instructions ? ? ?Important Information About Sugar ? ? ? ? ?  ?

## 2021-06-02 ENCOUNTER — Ambulatory Visit: Payer: 59 | Admitting: Physician Assistant

## 2021-06-16 ENCOUNTER — Ambulatory Visit (HOSPITAL_COMMUNITY): Payer: 59 | Attending: Cardiology

## 2021-06-16 DIAGNOSIS — N182 Chronic kidney disease, stage 2 (mild): Secondary | ICD-10-CM | POA: Insufficient documentation

## 2021-06-16 DIAGNOSIS — I10 Essential (primary) hypertension: Secondary | ICD-10-CM | POA: Insufficient documentation

## 2021-06-16 DIAGNOSIS — I5032 Chronic diastolic (congestive) heart failure: Secondary | ICD-10-CM | POA: Diagnosis present

## 2021-06-16 LAB — ECHOCARDIOGRAM COMPLETE
Area-P 1/2: 3.81 cm2
S' Lateral: 3.2 cm

## 2022-01-24 IMAGING — CR DG CHEST 2V
2 series · 2 of 2 positions shown · non-contrast
Comparison: July 19, 2008

CLINICAL DATA: Cough. Patient had [id number] last month. Shortness of
breath.

EXAM:
CHEST - 2 VIEW

[w chest pa]
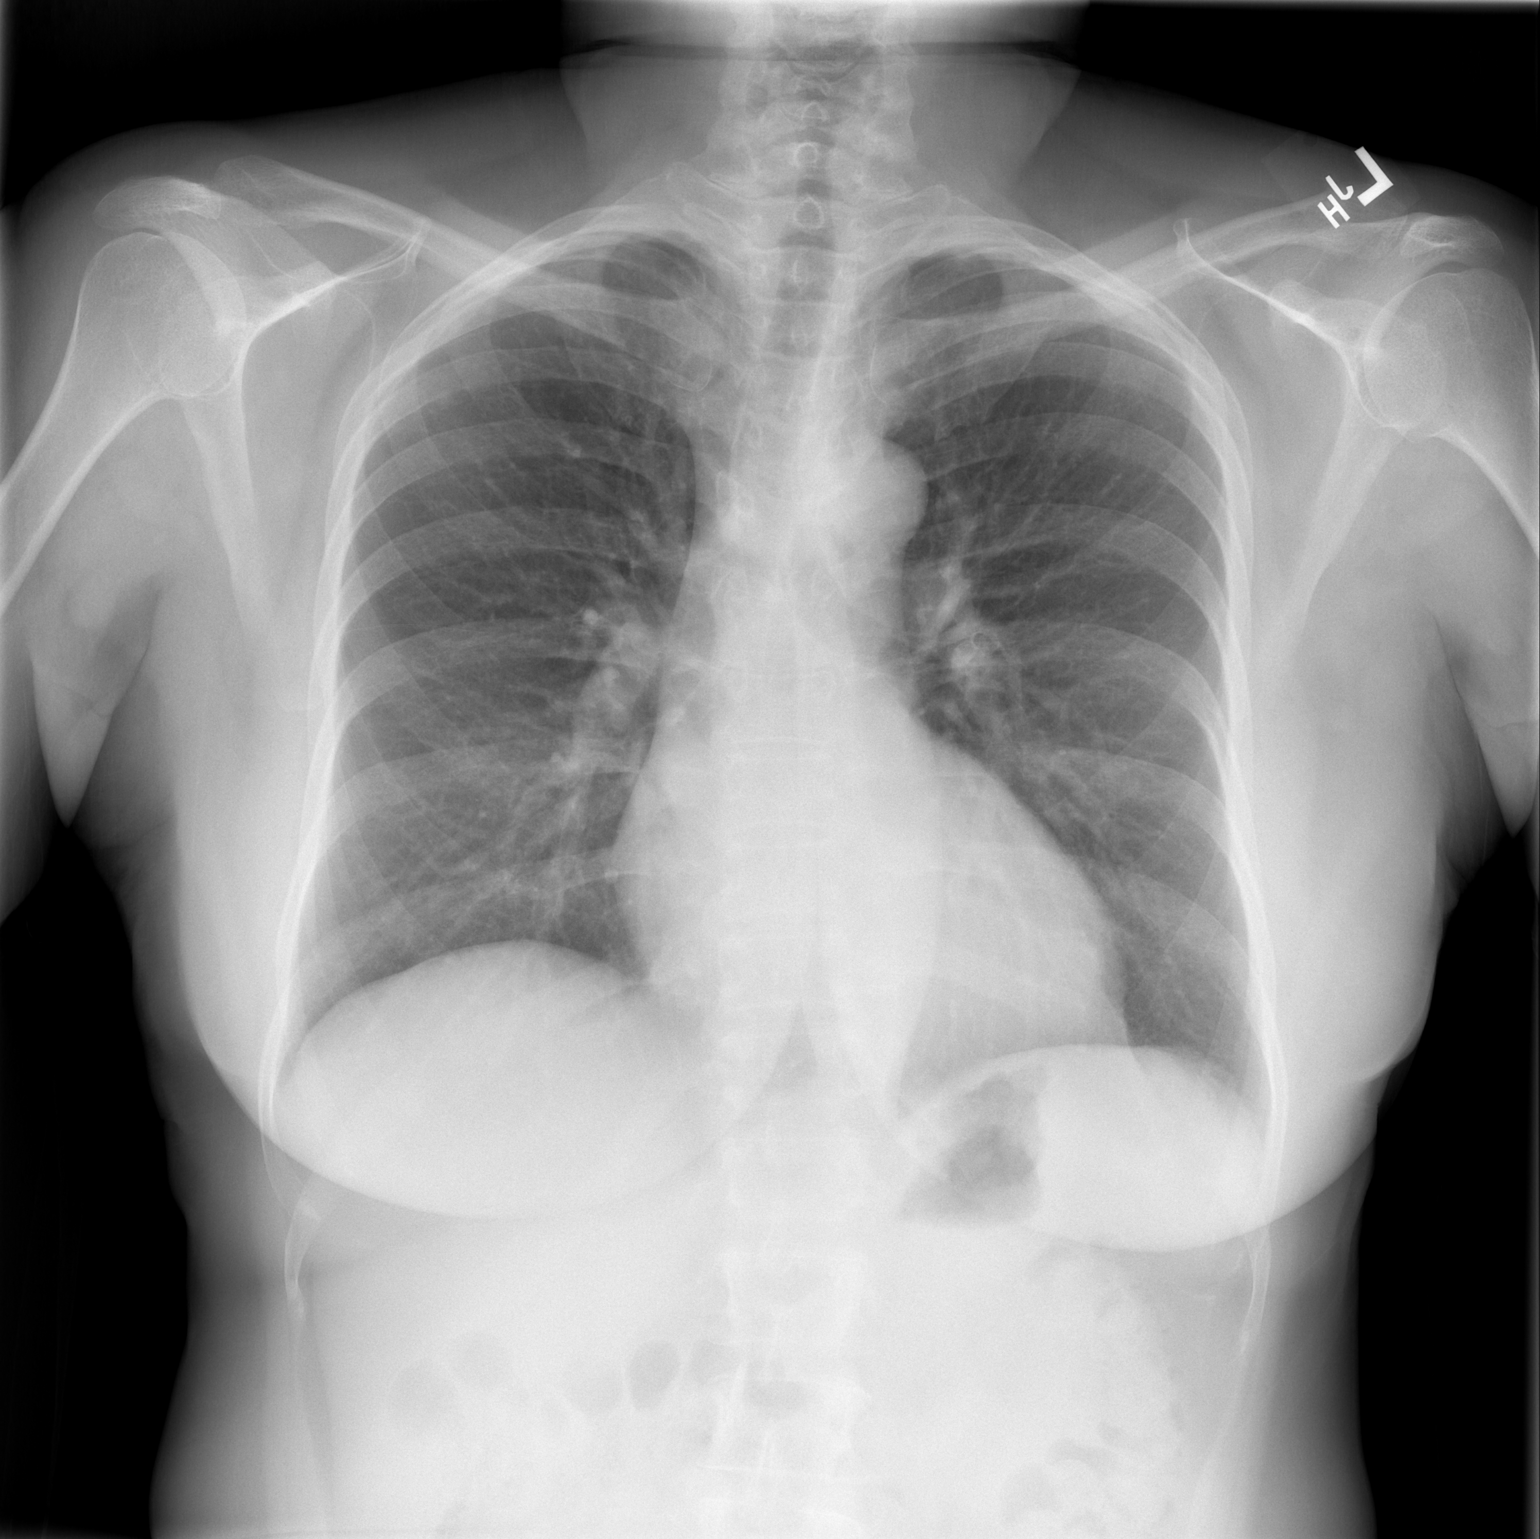

[w chest lat]
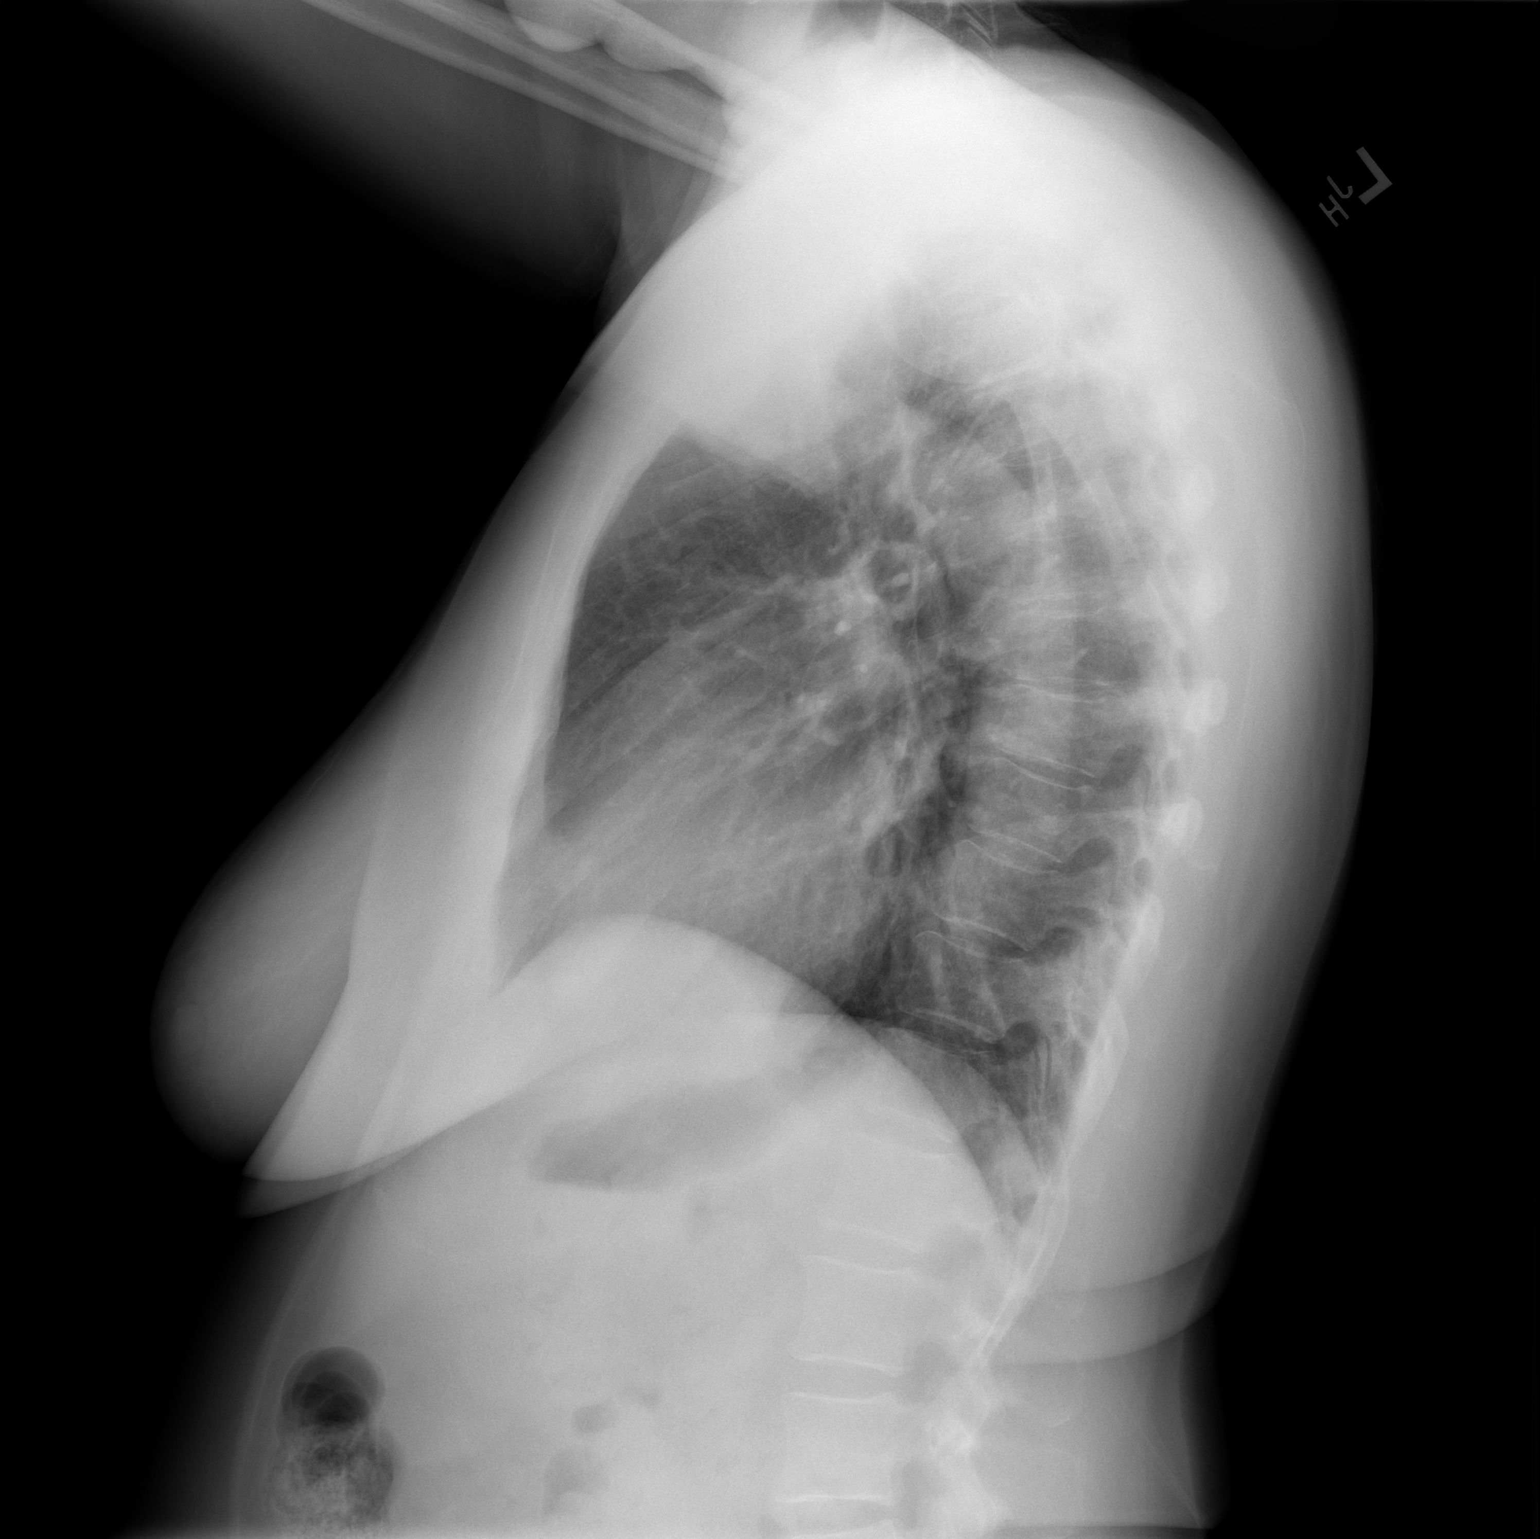

[2 of 2 positions shown; findings below may reference images not displayed]

FINDINGS: No pneumothorax. The heart, hila, and mediastinum are normal. No
nodules or masses. No focal infiltrates. No acute abnormalities.
IMPRESSION: No active cardiopulmonary disease.

## 2022-05-16 ENCOUNTER — Other Ambulatory Visit (HOSPITAL_COMMUNITY): Payer: Self-pay | Admitting: Family Medicine

## 2022-05-16 ENCOUNTER — Ambulatory Visit (HOSPITAL_BASED_OUTPATIENT_CLINIC_OR_DEPARTMENT_OTHER)
Admission: RE | Admit: 2022-05-16 | Discharge: 2022-05-16 | Disposition: A | Discharge status: Home / Self Care | Payer: 59 | Source: Ambulatory Visit | Attending: Family Medicine | Admitting: Family Medicine

## 2022-05-16 DIAGNOSIS — R1031 Right lower quadrant pain: Secondary | ICD-10-CM | POA: Insufficient documentation

## 2022-05-16 DIAGNOSIS — G8929 Other chronic pain: Secondary | ICD-10-CM | POA: Diagnosis present

## 2022-05-16 LAB — POCT I-STAT CREATININE: Creatinine, Ser: 0.8 mg/dL (ref 0.44–1.00)

## 2022-05-16 MED ORDER — IOHEXOL 300 MG/ML  SOLN
100.0000 mL | Freq: Once | INTRAMUSCULAR | Status: AC | PRN
  Administered 2022-05-16: 100 mL via INTRAVENOUS

## 2022-05-17 ENCOUNTER — Other Ambulatory Visit (HOSPITAL_BASED_OUTPATIENT_CLINIC_OR_DEPARTMENT_OTHER): Payer: Self-pay | Admitting: Family Medicine

## 2022-05-17 DIAGNOSIS — R1031 Right lower quadrant pain: Secondary | ICD-10-CM

## 2022-05-17 LAB — LAB REPORT - SCANNED: EGFR: 79

## 2022-07-04 ENCOUNTER — Ambulatory Visit: Payer: 59 | Admitting: Physician Assistant

## 2022-07-04 NOTE — Progress Notes (Unsigned)
Office Visit    Patient Name: Shannon Mason Date of Encounter: 07/05/2022  PCP:  Mila Palmer, MD   [facility]  Cardiologist:  Parke Poisson, MD  Advanced Practice Provider:  No care team member to display Electrophysiologist:  None   HPI    Shannon Mason is a 57 y.o. female with a past medical history of asthma, acute systolic heart failure with EF 15% in 2010 postpartum and recovered to 50% in 2011, CKD, HTN, and sickle cell trait presents today for follow-up appointment.  Patient followed for CHF and hypertension management.  Contracted COVID-19 in 2021 and was treated with monoclonal antibodies.  Developed orthopnea with elevated heart rate and some dyspnea on exertion.  2D echo was completed 01/2020 with EF 66 5%, normal LV function with mild LVH and grade 1 DD and MV regurgitation.  Managed with carvedilol 3.125 mg twice a day and Benicar 40/25 mg daily.  Was seen by Dr. Katrinka Blazing August 2021 and at that time reported she was doing well without any new cardiac complaints.  Did endorse some fatigue since contracting COVID-19 in 2021.  However, she states that she has been able to complete her daily tasks and fatigue is not debilitating.  Denies chest pain, palpitations, dyspnea, PND, orthopnea, nausea, vomiting, dizziness, syncope, edema, weight gain, and early society.  She did not have an organized exercise regimen.  When last seen by Robin Searing, NP 06/01/2021 she maintain healthy diet and watch her salt intake.  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  No recent symptoms.  Dr. Paulino Rily looks after her cholesterol.  Last year all values were in goal.  Recent lab work at [facility] rheumatology, we will have to request these records.  Denies chest pain or shortness of breath.  No lower extremity swelling.  Reports no shortness of breath nor  dyspnea on exertion. Reports no chest pain, pressure, or tightness. No edema, orthopnea, PND. Reports no palpitations.   Past Medical History    Past Medical History:  Diagnosis Date   Asthma    Back pain    DM (diabetes mellitus) (HCC) 2007   Gestational    Eczema    Fibroid    by MRI, 03-2008, s/p hysterectomy 05-2010   Genital herpes    type 2   Hernia, ventral 03-2008   small, by MRI    Hip pain    Hypertension    Kidney disease    Dr Briant Cedar   Neck pain    Postpartum cardiomyopathy    with CHF, EF 15% 07-19-2008;EF 50% on 2D echo 09-2009   Past Surgical History:  Procedure Laterality Date   ABDOMINAL HYSTERECTOMY  05-2010   HERNIA REPAIR     2010   OTHER SURGICAL HISTORY     Spleen repair   RENAL BIOPSY     Benign   TUBAL LIGATION     US ECHOCARDIOGRAPHY     2D Echo EF 50% 09-2009    Allergies  Allergies  Allergen Reactions   Aleve [Naproxen]    Ibuprofen     Kidney disease   Latex Itching   Vicodin [Hydrocodone-Acetaminophen] Nausea Only   Adhesive [Tape] Itching and Rash     EKGs/Labs/Other Studies Reviewed:   The following studies were reviewed today: Cardiac Studies & Procedures  ECHOCARDIOGRAM  ECHOCARDIOGRAM COMPLETE 06/16/2021  Narrative ECHOCARDIOGRAM REPORT    Patient Name:   Shannon Mason Laguna Date of Exam: 06/16/2021 Medical Rec #:  [id number]          Height:       64.0 in Accession #:    [id number]         Weight:       163.4 lb Date of Birth:  [birth year]         BSA:          1.795 m Patient Age:    55 years           BP:           124/78 mmHg Patient Gender: F                  HR:           81 bpm. Exam Location:  [address]  Procedure: 2D Echo, Cardiac Doppler, Color Doppler and Strain Analysis  Indications:    I50.32 CHF  History:        Patient has prior history of Echocardiogram examinations, most recent 01/28/2020. CHF; Risk Factors:Hypertension and Diabetes.  Sonographer:    Samule Ohm RDCS Referring  Phys: [phone] [place], JR DICK  IMPRESSIONS   1. Left ventricular ejection fraction, by estimation, is 55 to 60%. The left ventricle has normal function. The left ventricle has no regional wall motion abnormalities. There is moderate asymmetric left ventricular hypertrophy of the basal-septal segment. Left ventricular diastolic parameters are indeterminate. 2. Right ventricular systolic function is normal. The right ventricular size is normal. There is normal pulmonary artery systolic pressure. The estimated right ventricular systolic pressure is 24.3 mmHg. 3. The mitral valve is normal in structure. Trivial mitral valve regurgitation. 4. The aortic valve is tricuspid. Aortic valve regurgitation is not visualized. No aortic stenosis is present. 5. The inferior vena cava is normal in size with greater than 50% respiratory variability, suggesting right atrial pressure of 3 mmHg.  FINDINGS Left Ventricle: Left ventricular ejection fraction, by estimation, is 55 to 60%. The left ventricle has normal function. The left ventricle has no regional wall motion abnormalities. The left ventricular internal cavity size was normal in size. There is moderate asymmetric left ventricular hypertrophy of the basal-septal segment. Left ventricular diastolic parameters are indeterminate.  Right Ventricle: The right ventricular size is normal. No increase in right ventricular wall thickness. Right ventricular systolic function is normal. There is normal pulmonary artery systolic pressure. The tricuspid regurgitant velocity is 2.31 m/s, and with an assumed right atrial pressure of 3 mmHg, the estimated right ventricular systolic pressure is 24.3 mmHg.  Left Atrium: Left atrial size was normal in size.  Right Atrium: Right atrial size was normal in size.  Pericardium: There is no evidence of pericardial effusion.  Mitral Valve: The mitral valve is normal in structure. Trivial mitral valve  regurgitation.  Tricuspid Valve: The tricuspid valve is normal in structure. Tricuspid valve regurgitation is trivial.  Aortic Valve: The aortic valve is tricuspid. Aortic valve regurgitation is not visualized. No aortic stenosis is present.  Pulmonic Valve: The pulmonic valve was grossly normal. Pulmonic valve regurgitation is trivial.  Aorta: The aortic root and ascending aorta are structurally normal, with no evidence of dilitation.  Venous: The inferior vena cava is normal in size with greater than 50% respiratory variability, suggesting right atrial pressure of 3 mmHg.  IAS/Shunts: The interatrial septum was not well visualized.  LEFT VENTRICLE PLAX 2D LVIDd:         4.50 cm   Diastology LVIDs:         3.20 cm   LV e' medial:    5.98 cm/s LV PW:         1.10 cm   LV E/e' medial:  17.7 LV IVS:        1.40 cm   LV e' lateral:   8.38 cm/s LVOT diam:     2.10 cm   LV E/e' lateral: 12.6 LV SV:         70 LV SV Index:   39 LVOT Area:     3.46 cm   RIGHT VENTRICLE             IVC RV S prime:     15.20 cm/s  IVC diam: 1.10 cm TAPSE (M-mode): 1.9 cm RVSP:           24.3 mmHg  LEFT ATRIUM             Index        RIGHT ATRIUM           Index LA diam:        4.00 cm 2.23 cm/m   RA Pressure: 3.00 mmHg LA Vol (A2C):   38.8 ml 21.61 ml/m  RA Area:     13.40 cm LA Vol (A4C):   62.5 ml 34.81 ml/m  RA Volume:   32.80 ml  18.27 ml/m LA Biplane Vol: 53.9 ml 30.02 ml/m AORTIC VALVE LVOT Vmax:   95.60 cm/s LVOT Vmean:  58.300 cm/s LVOT VTI:    0.201 m  AORTA Ao Root diam: 3.00 cm Ao Asc diam:  2.90 cm  MITRAL VALVE                TRICUSPID VALVE MV Area (PHT): 3.81 cm     TR Peak grad:   21.3 mmHg MV Decel Time: 199 msec     TR Vmax:        231.00 cm/s MV E velocity: 106.00 cm/s  Estimated RAP:  3.00 mmHg MV A velocity: 146.00 cm/s  RVSP:           24.3 mmHg MV E/A ratio:  0.73 SHUNTS Systemic VTI:  0.20 m Systemic Diam: 2.10 cm  Epifanio Lesches  MD Electronically signed by Epifanio Lesches MD Signature Date/Time: 06/16/2021/12:02:25 PM    Final              EKG:  EKG is ordered today.  The ekg ordered today demonstrates normal sinus rhythm, rate 92 bpm  Recent Labs: 05/16/2022: Creatinine, Ser 0.80  Recent Lipid Panel    Component Value Date/Time   CHOL  07/20/2008 0355    140        ATP III CLASSIFICATION:  <200     mg/dL   Desirable  161-096  mg/dL   Borderline High  >=045    mg/dL   High          TRIG 409 07/20/2008 0355   HDL 32 (L) 07/20/2008 0355   CHOLHDL 4.4 07/20/2008 0355   VLDL 24 07/20/2008 0355   LDLCALC  07/20/2008 0355    84        Total Cholesterol/HDL:CHD Risk Coronary Heart Disease Risk Table                     Men   Women  1/2 Average Risk   3.4  3.3  Average Risk       5.0   4.4  2 X Average Risk   9.6   7.1  3 X Average Risk  23.4   11.0        Use the calculated Patient Ratio above and the CHD Risk Table to determine the patient's CHD Risk.        ATP III CLASSIFICATION (LDL):  <100     mg/dL   Optimal  811-914  mg/dL   Near or Above                    Optimal  130-159  mg/dL   Borderline  782-956  mg/dL   High  >213     mg/dL   Very High    Home Medications   Current Meds  Medication Sig   acetaminophen (TYLENOL) 500 MG tablet Take 1,000 mg by mouth every 6 (six) hours as needed for pain (pain).   Calcium Carbonate-Vitamin D 500-125 MG-UNIT TABS Take 1 tablet by mouth daily.   carvedilol (COREG) 6.25 MG tablet TAKE 1/2 TABLET BY MOUTH 2 (TWO) TIMES DAILY WITH A MEAL.   cetirizine (ZYRTEC) 10 MG tablet Take 10 mg by mouth daily.    ferrous fumarate (HEMOCYTE - 106 MG FE) 325 (106 FE) MG TABS Take 1 tablet by mouth daily.   folic acid (FOLVITE) 800 MCG tablet Take 400 mcg by mouth daily.   Glucosamine-Chondroitin (GLUCOSAMINE CHONDR COMPLEX PO) Take by mouth. Taking 1500-1200 mg Twice a day   Ixekizumab (TALTZ) 80 MG/ML SOAJ Inject 80 mg into the skin every 30 (thirty)  days.   Magnesium 250 MG TABS Take 250 mg by mouth daily.   meclizine (ANTIVERT) 25 MG tablet Take 25 mg by mouth 3 (three) times daily as needed (nausea).   Menthol, Topical Analgesic, (ICY HOT BACK EX) Apply 1 application  topically 3 (three) times daily as needed (pain).   methotrexate (RHEUMATREX) 2.5 MG tablet Take 4 tablets by mouth 2 (two) times a week.    Multiple Vitamins-Calcium (ONE-A-DAY WOMENS PO) Take by mouth daily.    olmesartan-hydrochlorothiazide (BENICAR HCT) 40-25 MG tablet Take 1 tablet by mouth daily.   Omega-3 Fatty Acids (FISH OIL PO) Take 1 capsule by mouth daily.   ondansetron (ZOFRAN ODT) 4 MG disintegrating tablet Take 1 tablet (4 mg total) by mouth every 8 (eight) hours as needed.   OTEZLA 30 MG TABS Take 30 mg by mouth 2 (two) times daily.   valACYclovir (VALTREX) 500 MG tablet Take 500 mg by mouth as needed (falre up).   vitamin B-12 (CYANOCOBALAMIN) 100 MCG tablet Take 500 mcg by mouth daily.     Review of Systems      All other systems reviewed and are otherwise negative except as noted above.  Physical Exam    VS:  BP 128/84   Pulse 92   Ht 5\' 4"  (1.626 m)   Wt 165 lb 12.8 oz (75.2 kg)   SpO2 98%   BMI 28.46 kg/m  , BMI Body mass index is 28.46 kg/m.  Wt Readings from Last 3 Encounters:  07/05/22 165 lb 12.8 oz (75.2 kg)  06/01/21 163 lb 6.4 oz (74.1 kg)  02/27/20 160 lb (72.6 kg)     GEN: Well nourished, well developed, in no acute distress. HEENT: normal. Neck: Supple, no JVD, carotid bruits, or masses. Cardiac: RRR, no murmurs, rubs, or gallops. No clubbing, cyanosis, edema.  Radials/PT 2+ and equal  bilaterally.  Respiratory:  Respirations regular and unlabored, clear to auscultation bilaterally. GI: Soft, nontender, nondistended. MS: No deformity or atrophy. Skin: Warm and dry, no rash. Neuro:  Strength and sensation are intact. Psych: Normal affect.  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  Signed, Sharlene Dory, PA-C 07/05/2022, 11:12 AM [facility]

## 2022-07-05 ENCOUNTER — Ambulatory Visit: Payer: 59 | Attending: Physician Assistant | Admitting: Physician Assistant

## 2022-07-05 ENCOUNTER — Encounter: Payer: Self-pay | Admitting: Physician Assistant

## 2022-07-05 VITALS — BP 128/84 | HR 92 | Ht 64.0 in | Wt 165.8 lb

## 2022-07-05 DIAGNOSIS — I340 Nonrheumatic mitral (valve) insufficiency: Secondary | ICD-10-CM | POA: Diagnosis not present

## 2022-07-05 DIAGNOSIS — N182 Chronic kidney disease, stage 2 (mild): Secondary | ICD-10-CM

## 2022-07-05 DIAGNOSIS — I5032 Chronic diastolic (congestive) heart failure: Secondary | ICD-10-CM

## 2022-07-05 DIAGNOSIS — I10 Essential (primary) hypertension: Secondary | ICD-10-CM

## 2022-07-05 NOTE — Patient Instructions (Signed)
Medication Instructions:    Your physician recommends that you continue on your current medications as directed. Please refer to the Current Medication list given to you today.   *If you need a refill on your cardiac medications before your next appointment, please call your pharmacy*   Lab Work:  NONE ORDERED  TODAY   If you have labs (blood work) drawn today and your tests are completely normal, you will receive your results only by: MyChart Message (if you have MyChart) OR A paper copy in the mail If you have any lab test that is abnormal or we need to change your treatment, we will call you to review the results.   Testing/Procedures: NONE ORDERED  TODAY     Follow-Up: At [facility], you and your health needs are our priority.  As part of our continuing mission to provide you with exceptional heart care, we have created designated Provider Care Teams.  These Care Teams include your primary Cardiologist (physician) and Advanced Practice Providers (APPs -  Physician Assistants and Nurse Practitioners) who all work together to provide you with the care you need, when you need it.  We recommend signing up for the patient portal called "MyChart".  Sign up information is provided on this After Visit Summary.  MyChart is used to connect with patients for Virtual Visits (Telemedicine).  Patients are able to view lab/test results, encounter notes, upcoming appointments, etc.  Non-urgent messages can be sent to your provider as well.   To learn more about what you can do with MyChart, go to [email or web address].    Your next appointment:   1 year(s)  Provider:   Parke Poisson, MD  Other Instructions     Low-Sodium Eating Plan Salt (sodium) helps you keep a healthy balance of fluids in your body. Too much sodium can raise your blood pressure. It can also cause fluid and waste to be held in your body. Your health care provider or dietitian may recommend a low-sodium  eating plan if you have high blood pressure (hypertension), kidney disease, liver disease, or heart failure. Eating less sodium can help lower your blood pressure and reduce swelling. It can also protect your heart, liver, and kidneys. What are tips for following this plan? Reading food labels  Check food labels for the amount of sodium per serving. If you eat more than one serving, you must multiply the listed amount by the number of servings. Choose foods with less than 140 milligrams (mg) of sodium per serving. Avoid foods with 300 mg of sodium or more per serving. Always check how much sodium is in a product, even if the label says "unsalted" or "no salt added." Shopping  Buy products labeled as "low-sodium" or "no salt added." Buy fresh foods. Avoid canned foods and pre-made or frozen meals. Avoid canned, cured, or processed meats. Buy breads that have less than 80 mg of sodium per slice. Cooking  Eat more home-cooked food. Try to eat less restaurant, buffet, and fast food. Try not to add salt when you cook. Use salt-free seasonings or herbs instead of table salt or sea salt. Check with your provider or pharmacist before using salt substitutes. Cook with plant-based oils, such as canola, sunflower, or olive oil. Meal planning When eating at a restaurant, ask if your food can be made with less salt or no salt. Avoid dishes labeled as brined, pickled, cured, or smoked. Avoid dishes made with soy sauce, miso, or teriyaki sauce. Avoid foods that  have monosodium glutamate (MSG) in them. MSG may be added to some restaurant food, sauces, soups, bouillon, and canned foods. Make meals that can be grilled, baked, poached, roasted, or steamed. These are often made with less sodium. General information Try to limit your sodium intake to 1,500-2,300 mg each day, or the amount told by your provider. What foods should I eat? Fruits Fresh, frozen, or canned fruit. Fruit juice. Vegetables Fresh or  frozen vegetables. "No salt added" canned vegetables. "No salt added" tomato sauce and paste. Low-sodium or reduced-sodium tomato and vegetable juice. Grains Low-sodium cereals, such as oats, puffed wheat and rice, and shredded wheat. Low-sodium crackers. Unsalted rice. Unsalted pasta. Low-sodium bread. Whole grain breads and whole grain pasta. Meats and other proteins Fresh or frozen meat, poultry, seafood, and fish. These should have no added salt. Low-sodium canned tuna and salmon. Unsalted nuts. Dried peas, beans, and lentils without added salt. Unsalted canned beans. Eggs. Unsalted nut butters. Dairy Milk. Soy milk. Cheese that is naturally low in sodium, such as ricotta cheese, fresh mozzarella, or Swiss cheese. Low-sodium or reduced-sodium cheese. Cream cheese. Yogurt. Seasonings and condiments Fresh and dried herbs and spices. Salt-free seasonings. Low-sodium mustard and ketchup. Sodium-free salad dressing. Sodium-free light mayonnaise. Fresh or refrigerated horseradish. Lemon juice. Vinegar. Other foods Homemade, reduced-sodium, or low-sodium soups. Unsalted popcorn and pretzels. Low-salt or salt-free chips. The items listed above may not be all the foods and drinks you can have. Talk to a dietitian to learn more. What foods should I avoid? Vegetables Sauerkraut, pickled vegetables, and relishes. Olives. [place] fries. Onion rings. Regular canned vegetables, except low-sodium or reduced-sodium items. Regular canned tomato sauce and paste. Regular tomato and vegetable juice. Frozen vegetables in sauces. Grains Instant hot cereals. Bread stuffing, pancake, and biscuit mixes. Croutons. Seasoned rice or pasta mixes. Noodle soup cups. Boxed or frozen macaroni and cheese. Regular salted crackers. Self-rising flour. Meats and other proteins Meat or fish that is salted, canned, smoked, spiced, or pickled. Precooked or cured meat, such as sausages or meat loaves. Tomasa Blase. Ham. Pepperoni. Hot dogs.  Corned beef. Chipped beef. Salt pork. Jerky. Pickled herring, anchovies, and sardines. Regular canned tuna. Salted nuts. Dairy Processed cheese and cheese spreads. Hard cheeses. Cheese curds. Blue cheese. Feta cheese. String cheese. Regular cottage cheese. Buttermilk. Canned milk. Fats and oils Salted butter. Regular margarine. Ghee. Bacon fat. Seasonings and condiments Onion salt, garlic salt, seasoned salt, table salt, and sea salt. Canned and packaged gravies. Worcestershire sauce. Tartar sauce. Barbecue sauce. Teriyaki sauce. Soy sauce, including reduced-sodium soy sauce. Steak sauce. Fish sauce. Oyster sauce. Cocktail sauce. Horseradish that you find on the shelf. Regular ketchup and mustard. Meat flavorings and tenderizers. Bouillon cubes. Hot sauce. Pre-made or packaged marinades. Pre-made or packaged taco seasonings. Relishes. Regular salad dressings. Salsa. Other foods Salted popcorn and pretzels. Corn chips and puffs. Potato and tortilla chips. Canned or dried soups. Pizza. Frozen entrees and pot pies. The items listed above may not be all the foods and drinks you should avoid. Talk to a dietitian to learn more. This information is not intended to replace advice given to you by your health care provider. Make sure you discuss any questions you have with your health care provider. Document Revised: 02/08/2022 Document Reviewed: 02/08/2022 Elsevier Patient Education  2024 [employer].

## 2022-09-17 ENCOUNTER — Telehealth: Payer: Self-pay | Admitting: Interventional Cardiology

## 2022-09-17 DIAGNOSIS — I5032 Chronic diastolic (congestive) heart failure: Secondary | ICD-10-CM

## 2022-09-17 DIAGNOSIS — I10 Essential (primary) hypertension: Secondary | ICD-10-CM

## 2022-09-17 MED ORDER — CARVEDILOL 6.25 MG PO TABS: 6.2500 mg | ORAL_TABLET | Freq: Two times a day (BID) | ORAL | 2 refills | Status: DC

## 2022-09-17 NOTE — Telephone Encounter (Signed)
*  STAT* If patient is at the pharmacy, call can be transferred to refill team.   1. Which medications need to be refilled? (please list name of each medication and dose if known) carvedilol (COREG) 6.25 MG tablet  2. Which pharmacy/location (including street and city if local pharmacy) is medication to be sent to? [id number] [place], [place] - [address]  3. Do they need a 30 day or 90 day supply? [address]

## 2022-09-17 NOTE — Telephone Encounter (Signed)
Pt's medication was sent to pt's pharmacy as requested. Confirmation received.  °

## 2023-10-21 ENCOUNTER — Other Ambulatory Visit: Payer: Self-pay | Admitting: Physician Assistant

## 2023-10-21 ENCOUNTER — Other Ambulatory Visit: Payer: Self-pay

## 2023-10-21 ENCOUNTER — Other Ambulatory Visit (HOSPITAL_COMMUNITY): Payer: Self-pay

## 2023-10-21 DIAGNOSIS — I10 Essential (primary) hypertension: Secondary | ICD-10-CM

## 2023-10-21 DIAGNOSIS — I5032 Chronic diastolic (congestive) heart failure: Secondary | ICD-10-CM

## 2023-10-21 MED ORDER — CARVEDILOL 6.25 MG PO TABS: 6.2500 mg | ORAL_TABLET | Freq: Two times a day (BID) | ORAL | 0 refills | Status: DC

## 2023-10-21 MED ORDER — CARVEDILOL 6.25 MG PO TABS
6.2500 mg | ORAL_TABLET | Freq: Two times a day (BID) | ORAL | 0 refills | Status: DC
  Filled 2023-10-21: qty 60, 30d supply, fill #0

## 2023-10-23 ENCOUNTER — Other Ambulatory Visit (HOSPITAL_COMMUNITY): Payer: Self-pay

## 2023-10-23 ENCOUNTER — Other Ambulatory Visit: Payer: Self-pay

## 2023-10-23 DIAGNOSIS — I10 Essential (primary) hypertension: Secondary | ICD-10-CM

## 2023-10-23 DIAGNOSIS — I5032 Chronic diastolic (congestive) heart failure: Secondary | ICD-10-CM

## 2023-10-23 MED ORDER — CARVEDILOL 6.25 MG PO TABS: 6.2500 mg | ORAL_TABLET | Freq: Two times a day (BID) | ORAL | 0 refills | Status: DC

## 2023-10-23 NOTE — Telephone Encounter (Signed)
 Pt's medication was sent to wrong pharmacy. Medication resent to pt's preferred pharmacy. Confirmation received.

## 2023-10-29 ENCOUNTER — Other Ambulatory Visit (HOSPITAL_BASED_OUTPATIENT_CLINIC_OR_DEPARTMENT_OTHER): Payer: Self-pay | Admitting: Family Medicine

## 2023-10-29 DIAGNOSIS — E2839 Other primary ovarian failure: Secondary | ICD-10-CM

## 2023-12-11 ENCOUNTER — Other Ambulatory Visit: Payer: Self-pay | Admitting: Physician Assistant

## 2023-12-11 DIAGNOSIS — I5032 Chronic diastolic (congestive) heart failure: Secondary | ICD-10-CM

## 2023-12-11 DIAGNOSIS — I10 Essential (primary) hypertension: Secondary | ICD-10-CM

## 2023-12-26 ENCOUNTER — Other Ambulatory Visit: Payer: Self-pay | Admitting: Physician Assistant

## 2023-12-26 DIAGNOSIS — I10 Essential (primary) hypertension: Secondary | ICD-10-CM

## 2023-12-26 DIAGNOSIS — I5032 Chronic diastolic (congestive) heart failure: Secondary | ICD-10-CM

## 2023-12-26 MED ORDER — CARVEDILOL 6.25 MG PO TABS: 6.2500 mg | ORAL_TABLET | Freq: Two times a day (BID) | ORAL | 0 refills | Status: DC

## 2024-02-14 ENCOUNTER — Other Ambulatory Visit: Payer: Self-pay | Admitting: Physician Assistant

## 2024-02-14 DIAGNOSIS — I5032 Chronic diastolic (congestive) heart failure: Secondary | ICD-10-CM

## 2024-02-14 DIAGNOSIS — I10 Essential (primary) hypertension: Secondary | ICD-10-CM

## 2024-03-19 ENCOUNTER — Ambulatory Visit: Payer: Self-pay | Admitting: Internal Medicine
